# Patient Record
Sex: Female | Born: 1975 | Race: Black or African American | Hispanic: No | Marital: Single | Smoking: Never smoker
Health system: Southern US, Community
[De-identification: ages and names within clinical notes are randomized; demographics above are authoritative.]

## PROBLEM LIST (undated history)

## (undated) ENCOUNTER — Inpatient Hospital Stay (HOSPITAL_COMMUNITY): Payer: Self-pay

## (undated) DIAGNOSIS — R87610 Atypical squamous cells of undetermined significance on cytologic smear of cervix (ASC-US): Secondary | ICD-10-CM

## (undated) DIAGNOSIS — K219 Gastro-esophageal reflux disease without esophagitis: Secondary | ICD-10-CM

## (undated) DIAGNOSIS — I10 Essential (primary) hypertension: Secondary | ICD-10-CM

## (undated) DIAGNOSIS — Z8759 Personal history of other complications of pregnancy, childbirth and the puerperium: Secondary | ICD-10-CM

## (undated) HISTORY — DX: Gastro-esophageal reflux disease without esophagitis: K21.9

## (undated) HISTORY — DX: Personal history of other complications of pregnancy, childbirth and the puerperium: Z87.59

## (undated) HISTORY — DX: Atypical squamous cells of undetermined significance on cytologic smear of cervix (ASC-US): R87.610

## (undated) HISTORY — PX: DILATION AND CURETTAGE OF UTERUS: SHX78

---

## 1998-01-14 ENCOUNTER — Emergency Department (HOSPITAL_COMMUNITY): Admission: EM | Admit: 1998-01-14 | Discharge: 1998-01-14 | Payer: Self-pay | Admitting: Emergency Medicine

## 1999-01-29 ENCOUNTER — Encounter: Admission: RE | Admit: 1999-01-29 | Discharge: 1999-01-29 | Payer: Self-pay | Admitting: Family Medicine

## 1999-02-10 ENCOUNTER — Encounter: Admission: RE | Admit: 1999-02-10 | Discharge: 1999-02-10 | Payer: Self-pay | Admitting: Family Medicine

## 1999-02-26 ENCOUNTER — Encounter: Admission: RE | Admit: 1999-02-26 | Discharge: 1999-02-26 | Payer: Self-pay | Admitting: Family Medicine

## 1999-07-11 ENCOUNTER — Emergency Department (HOSPITAL_COMMUNITY): Admission: EM | Admit: 1999-07-11 | Discharge: 1999-07-11 | Payer: Self-pay | Admitting: Emergency Medicine

## 1999-07-11 ENCOUNTER — Encounter: Payer: Self-pay | Admitting: Emergency Medicine

## 1999-07-22 ENCOUNTER — Encounter: Admission: RE | Admit: 1999-07-22 | Discharge: 1999-07-22 | Payer: Self-pay | Admitting: Family Medicine

## 1999-08-03 ENCOUNTER — Encounter: Admission: RE | Admit: 1999-08-03 | Discharge: 1999-08-03 | Payer: Self-pay | Admitting: Family Medicine

## 2000-11-24 ENCOUNTER — Other Ambulatory Visit: Admission: RE | Admit: 2000-11-24 | Discharge: 2000-11-24 | Payer: Self-pay | Admitting: Obstetrics and Gynecology

## 2001-02-22 ENCOUNTER — Encounter: Admission: RE | Admit: 2001-02-22 | Discharge: 2001-02-22 | Payer: Self-pay | Admitting: Family Medicine

## 2001-03-01 ENCOUNTER — Encounter: Admission: RE | Admit: 2001-03-01 | Discharge: 2001-03-01 | Payer: Self-pay | Admitting: Family Medicine

## 2001-03-23 ENCOUNTER — Encounter: Admission: RE | Admit: 2001-03-23 | Discharge: 2001-03-23 | Payer: Self-pay | Admitting: Family Medicine

## 2001-08-28 ENCOUNTER — Encounter: Admission: RE | Admit: 2001-08-28 | Discharge: 2001-08-28 | Payer: Self-pay | Admitting: Family Medicine

## 2001-12-29 ENCOUNTER — Encounter: Admission: RE | Admit: 2001-12-29 | Discharge: 2001-12-29 | Payer: Self-pay | Admitting: Family Medicine

## 2002-01-05 ENCOUNTER — Encounter: Admission: RE | Admit: 2002-01-05 | Discharge: 2002-01-05 | Payer: Self-pay | Admitting: Family Medicine

## 2002-02-08 ENCOUNTER — Encounter: Admission: RE | Admit: 2002-02-08 | Discharge: 2002-02-08 | Payer: Self-pay | Admitting: Family Medicine

## 2002-05-09 ENCOUNTER — Other Ambulatory Visit: Admission: RE | Admit: 2002-05-09 | Discharge: 2002-05-09 | Payer: Self-pay | Admitting: *Deleted

## 2002-07-10 ENCOUNTER — Encounter: Admission: RE | Admit: 2002-07-10 | Discharge: 2002-07-10 | Payer: Self-pay | Admitting: Family Medicine

## 2002-08-29 ENCOUNTER — Encounter: Admission: RE | Admit: 2002-08-29 | Discharge: 2002-08-29 | Payer: Self-pay | Admitting: Family Medicine

## 2002-09-10 ENCOUNTER — Encounter: Admission: RE | Admit: 2002-09-10 | Discharge: 2002-09-10 | Payer: Self-pay | Admitting: Family Medicine

## 2002-11-13 ENCOUNTER — Encounter: Admission: RE | Admit: 2002-11-13 | Discharge: 2002-11-13 | Payer: Self-pay | Admitting: Family Medicine

## 2003-02-18 ENCOUNTER — Encounter: Payer: Self-pay | Admitting: Specialist

## 2003-02-18 ENCOUNTER — Encounter: Admission: RE | Admit: 2003-02-18 | Discharge: 2003-02-18 | Payer: Self-pay | Admitting: Specialist

## 2003-04-25 ENCOUNTER — Encounter: Admission: RE | Admit: 2003-04-25 | Discharge: 2003-04-25 | Payer: Self-pay | Admitting: Sports Medicine

## 2003-07-30 ENCOUNTER — Encounter: Admission: RE | Admit: 2003-07-30 | Discharge: 2003-07-30 | Payer: Self-pay | Admitting: Family Medicine

## 2003-10-08 ENCOUNTER — Encounter: Admission: RE | Admit: 2003-10-08 | Discharge: 2003-10-08 | Payer: Self-pay | Admitting: Family Medicine

## 2004-06-04 ENCOUNTER — Ambulatory Visit: Payer: Self-pay | Admitting: Family Medicine

## 2004-09-08 ENCOUNTER — Ambulatory Visit: Payer: Self-pay | Admitting: Family Medicine

## 2004-11-03 ENCOUNTER — Ambulatory Visit: Payer: Self-pay | Admitting: Family Medicine

## 2005-07-21 ENCOUNTER — Encounter (INDEPENDENT_AMBULATORY_CARE_PROVIDER_SITE_OTHER): Payer: Self-pay | Admitting: *Deleted

## 2005-07-21 ENCOUNTER — Ambulatory Visit: Payer: Self-pay | Admitting: Family Medicine

## 2005-10-06 ENCOUNTER — Emergency Department (HOSPITAL_COMMUNITY): Admission: EM | Admit: 2005-10-06 | Discharge: 2005-10-06 | Payer: Self-pay | Admitting: Family Medicine

## 2005-10-12 ENCOUNTER — Ambulatory Visit: Payer: Self-pay | Admitting: Family Medicine

## 2005-12-03 ENCOUNTER — Ambulatory Visit: Payer: Self-pay | Admitting: Family Medicine

## 2005-12-07 ENCOUNTER — Ambulatory Visit: Payer: Self-pay | Admitting: Sports Medicine

## 2005-12-09 ENCOUNTER — Ambulatory Visit: Payer: Self-pay | Admitting: Sports Medicine

## 2005-12-10 ENCOUNTER — Encounter: Admission: RE | Admit: 2005-12-10 | Discharge: 2005-12-10 | Payer: Self-pay | Admitting: Sports Medicine

## 2006-01-19 ENCOUNTER — Encounter (INDEPENDENT_AMBULATORY_CARE_PROVIDER_SITE_OTHER): Payer: Self-pay | Admitting: *Deleted

## 2006-01-19 LAB — CONVERTED CEMR LAB

## 2006-02-11 ENCOUNTER — Ambulatory Visit: Payer: Self-pay | Admitting: Family Medicine

## 2006-06-24 ENCOUNTER — Encounter (INDEPENDENT_AMBULATORY_CARE_PROVIDER_SITE_OTHER): Payer: Self-pay | Admitting: Family Medicine

## 2006-06-24 ENCOUNTER — Ambulatory Visit: Payer: Self-pay | Admitting: Family Medicine

## 2006-08-19 ENCOUNTER — Encounter (INDEPENDENT_AMBULATORY_CARE_PROVIDER_SITE_OTHER): Payer: Self-pay | Admitting: *Deleted

## 2006-11-30 ENCOUNTER — Ambulatory Visit: Payer: Self-pay | Admitting: Family Medicine

## 2006-12-26 ENCOUNTER — Telehealth: Payer: Self-pay | Admitting: *Deleted

## 2006-12-29 ENCOUNTER — Ambulatory Visit: Payer: Self-pay | Admitting: Family Medicine

## 2006-12-29 DIAGNOSIS — Z8613 Personal history of malaria: Secondary | ICD-10-CM

## 2007-07-12 ENCOUNTER — Telehealth: Payer: Self-pay | Admitting: *Deleted

## 2007-07-28 ENCOUNTER — Encounter (INDEPENDENT_AMBULATORY_CARE_PROVIDER_SITE_OTHER): Payer: Self-pay | Admitting: Family Medicine

## 2007-07-28 ENCOUNTER — Ambulatory Visit: Payer: Self-pay | Admitting: Family Medicine

## 2007-07-28 ENCOUNTER — Telehealth: Payer: Self-pay | Admitting: *Deleted

## 2007-07-28 LAB — CONVERTED CEMR LAB
Blood in Urine, dipstick: NEGATIVE
Chlamydia, DNA Probe: NEGATIVE
GC Probe Amp, Genital: NEGATIVE
Nitrite: NEGATIVE
Protein, U semiquant: NEGATIVE
Urobilinogen, UA: 0.2
WBC Urine, dipstick: NEGATIVE
Whiff Test: NEGATIVE

## 2007-08-01 ENCOUNTER — Encounter (INDEPENDENT_AMBULATORY_CARE_PROVIDER_SITE_OTHER): Payer: Self-pay | Admitting: Family Medicine

## 2007-08-09 ENCOUNTER — Telehealth (INDEPENDENT_AMBULATORY_CARE_PROVIDER_SITE_OTHER): Payer: Self-pay | Admitting: *Deleted

## 2007-08-14 ENCOUNTER — Ambulatory Visit (HOSPITAL_COMMUNITY): Admission: RE | Admit: 2007-08-14 | Discharge: 2007-08-14 | Payer: Self-pay | Admitting: Family Medicine

## 2007-08-24 ENCOUNTER — Ambulatory Visit: Payer: Self-pay | Admitting: Family Medicine

## 2008-07-06 IMAGING — US US TRANSVAGINAL NON-OB
1 series · 14 of 25 positions shown · non-contrast
Comparison: none

CLINICAL DATA: Pain.
TRANSABDOMINAL AND TRANSVAGINAL PELVIC ULTRASOUND ? 08/14/07:
TECHNIQUE: Both transabdominal and transvaginal ultrasound examinations of the pelvis were performed including evaluation of the uterus, ovaries, adnexal regions, and pelvic cul-de-sac.

[Series 1: us transvaginal non-ob · 0.24mm/px · 14 of 43 slices shown]
[im 1/43]
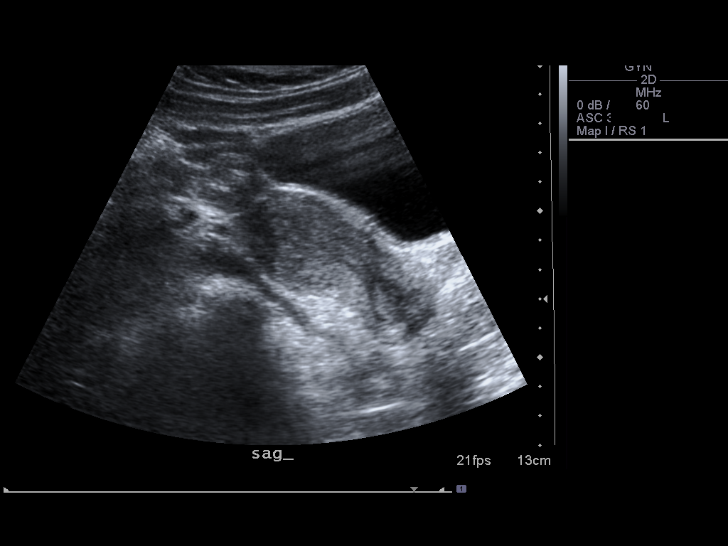
[im 4/43]
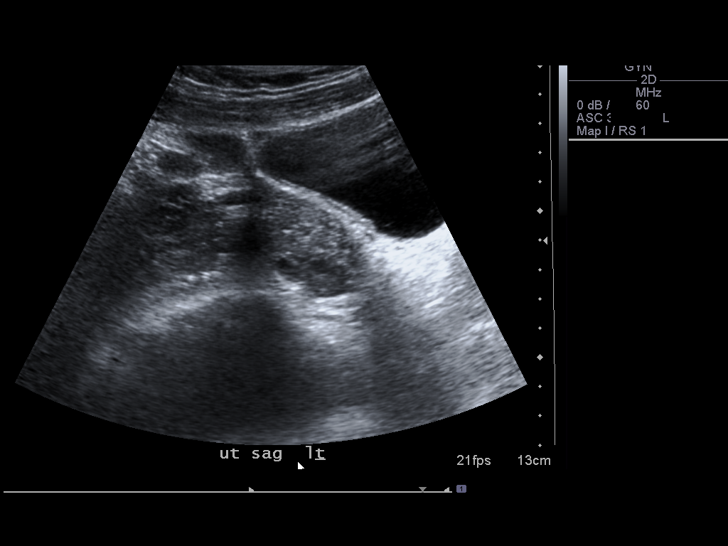
[im 8/43]
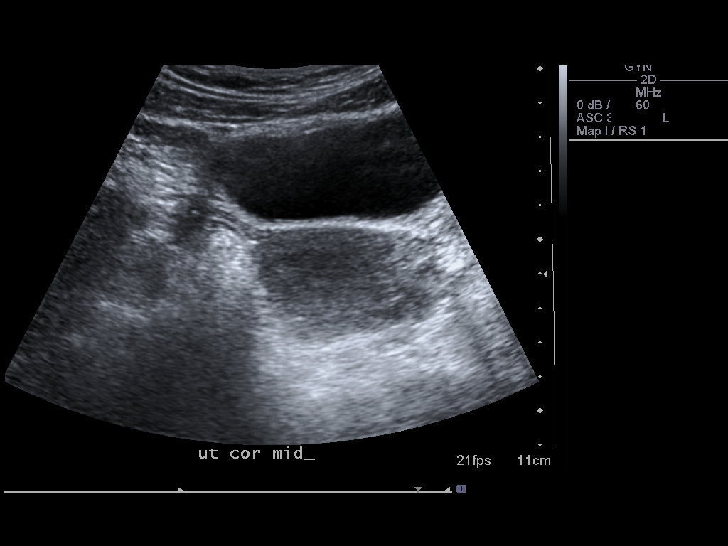
[im 11/43]
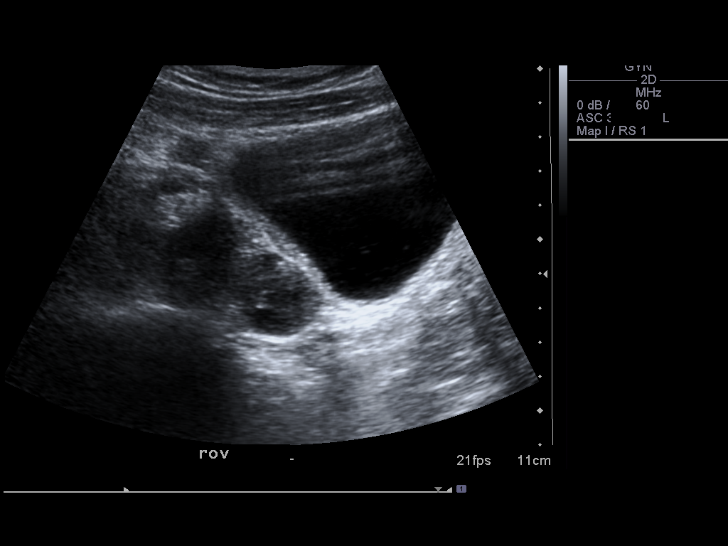
[im 15/43]
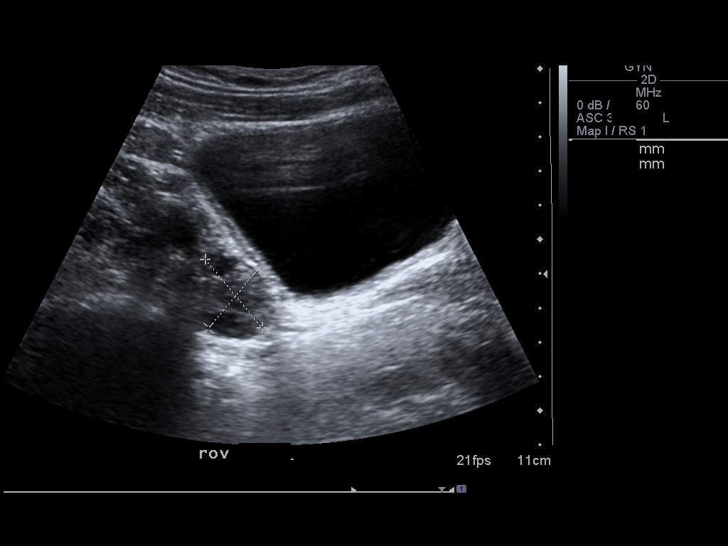
[im 16/43]
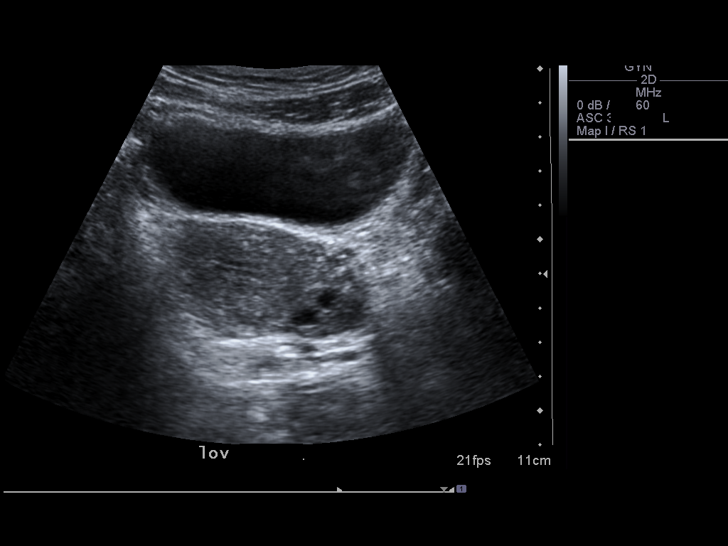
[im 20/43]
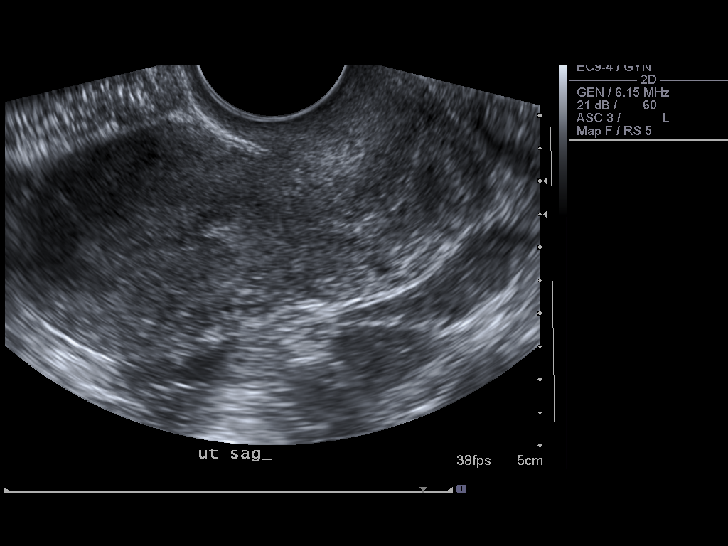
[im 23/43]
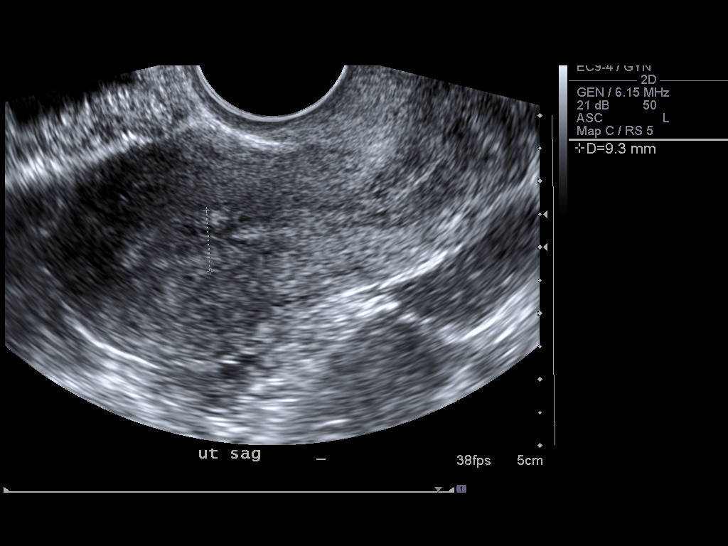
[im 27/43]
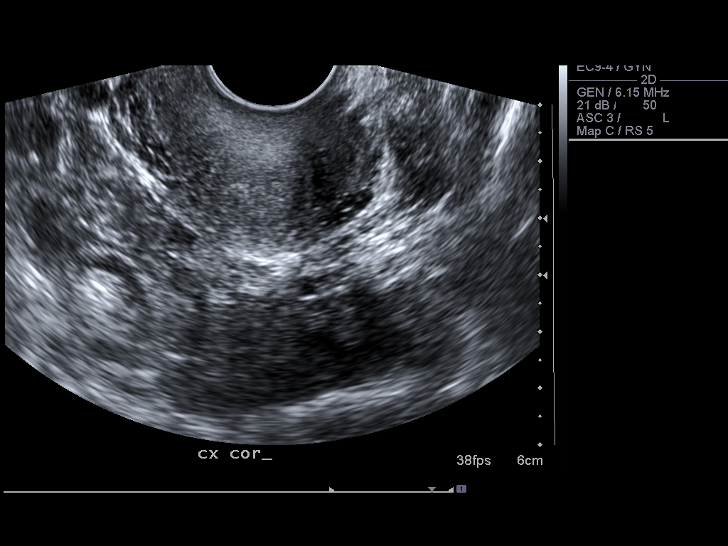
[im 29/43]
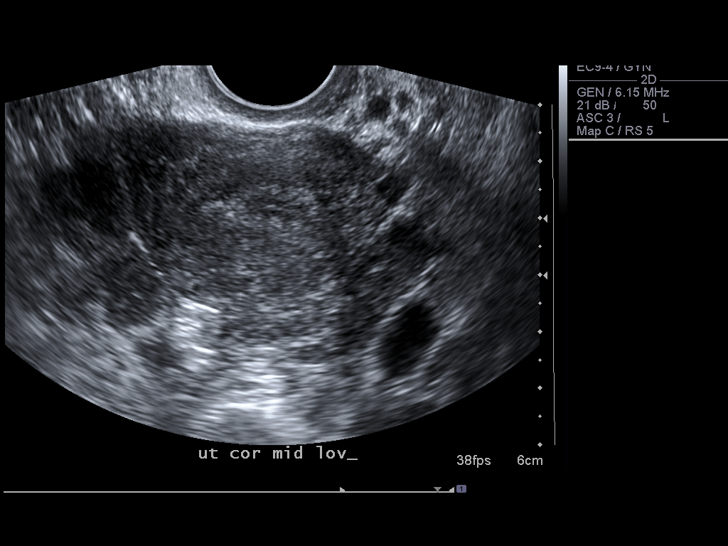
[im 32/43]
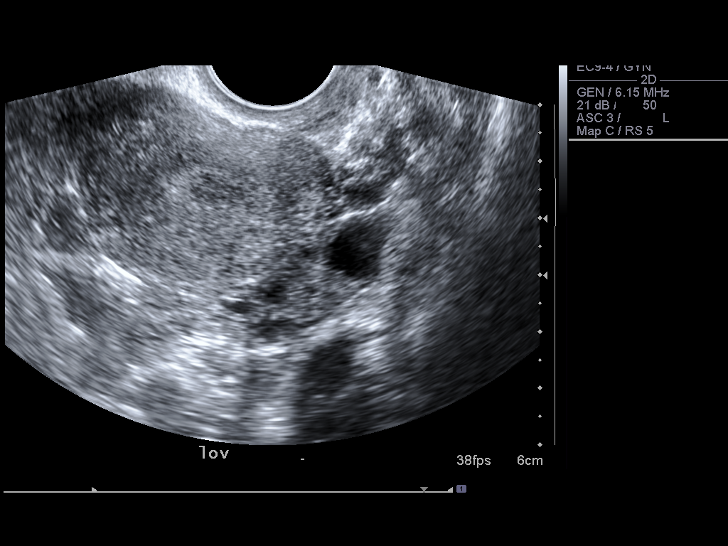
[im 36/43]
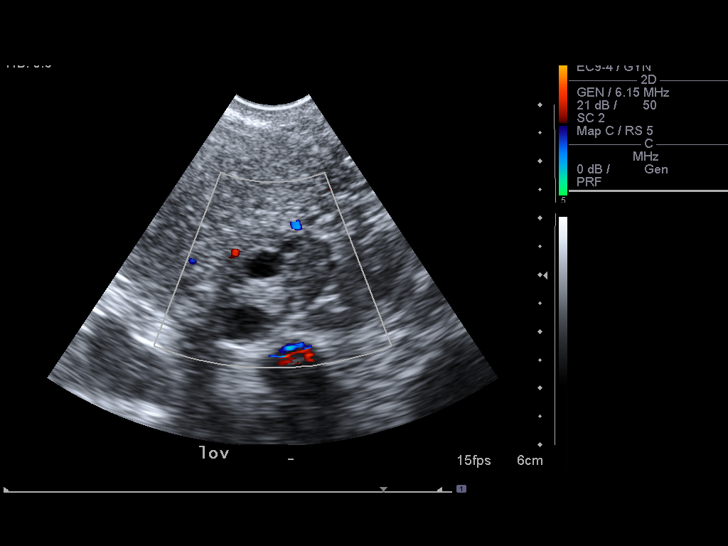
[im 39/43]
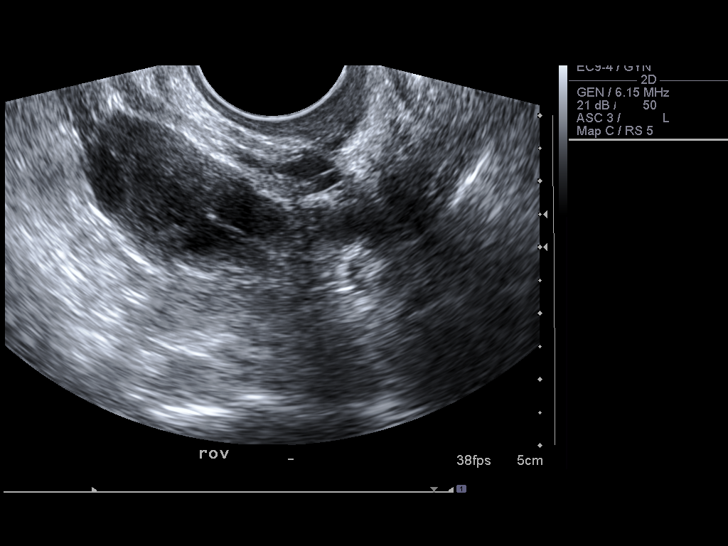
[im 43/43]
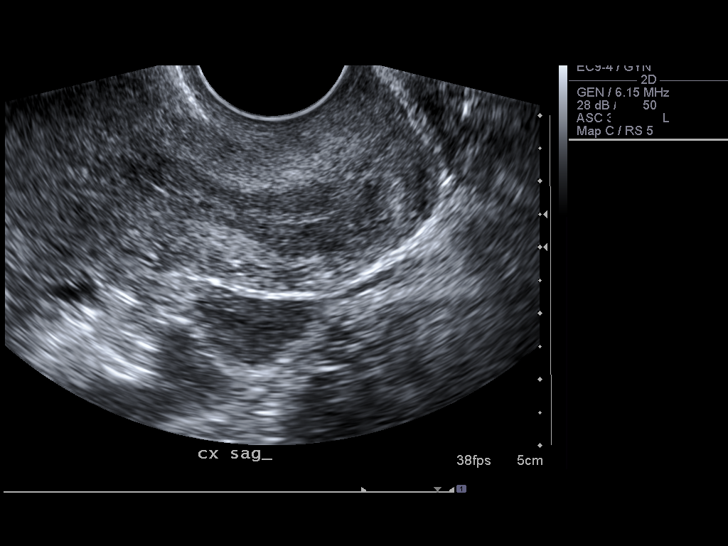

[14 of 25 positions shown; findings below may reference images not displayed]

FINDINGS: The uterus is 7.2 cm in length x 3.3 cm AP diameter x 4.5 cm in width.  The endometrium is 8 mm in thickness.  
Both ovaries are normal in size measuring 3.0 x 1.7 x 3.0 cm on the right and 3.0 x 1.3 x 1.8 cm on the left.  Both ovaries contain follicles.  No adnexal mass is identified.  There is no free fluid.
IMPRESSION: Pelvic ultrasound within normal limits.  No etiology for the patient?s pain is identified.

## 2008-08-12 ENCOUNTER — Encounter (INDEPENDENT_AMBULATORY_CARE_PROVIDER_SITE_OTHER): Payer: Self-pay | Admitting: Family Medicine

## 2008-08-12 ENCOUNTER — Ambulatory Visit: Payer: Self-pay | Admitting: Family Medicine

## 2008-08-12 LAB — CONVERTED CEMR LAB
AST: 12 units/L (ref 0–37)
BUN: 15 mg/dL (ref 6–23)
Calcium: 8.9 mg/dL (ref 8.4–10.5)
Chloride: 108 meq/L (ref 96–112)
Cholesterol: 148 mg/dL (ref 0–200)
Creatinine, Ser: 0.77 mg/dL (ref 0.40–1.20)
GC Probe Amp, Genital: NEGATIVE
HCT: 37.7 % (ref 36.0–46.0)
HDL: 63 mg/dL (ref >39)
Hemoglobin: 12.1 g/dL (ref 12.0–15.0)
MCV: 93.8 fL (ref 78.0–100.0)
Platelets: 241 10*3/uL (ref 150–400)
RDW: 12.9 % (ref 11.5–15.5)
TSH: 2.16 microintl units/mL (ref 0.350–4.50)
Total Bilirubin: 0.2 mg/dL — ABNORMAL LOW (ref 0.3–1.2)
Total CHOL/HDL Ratio: 2.3
VLDL: 9 mg/dL (ref 0–40)
Whiff Test: NEGATIVE

## 2008-08-16 ENCOUNTER — Encounter (INDEPENDENT_AMBULATORY_CARE_PROVIDER_SITE_OTHER): Payer: Self-pay | Admitting: Family Medicine

## 2009-02-18 ENCOUNTER — Telehealth: Payer: Self-pay | Admitting: Family Medicine

## 2009-02-19 ENCOUNTER — Ambulatory Visit: Payer: Self-pay | Admitting: Family Medicine

## 2009-02-19 ENCOUNTER — Encounter: Payer: Self-pay | Admitting: Family Medicine

## 2009-02-19 LAB — CONVERTED CEMR LAB
Beta hcg, urine, semiquantitative: NEGATIVE
GC Probe Amp, Genital: NEGATIVE
Whiff Test: NEGATIVE

## 2009-02-21 ENCOUNTER — Encounter (INDEPENDENT_AMBULATORY_CARE_PROVIDER_SITE_OTHER): Payer: Self-pay | Admitting: *Deleted

## 2009-09-04 ENCOUNTER — Ambulatory Visit: Payer: Self-pay | Admitting: Family Medicine

## 2009-09-04 ENCOUNTER — Encounter: Payer: Self-pay | Admitting: Family Medicine

## 2009-09-04 LAB — CONVERTED CEMR LAB
BUN: 12 mg/dL (ref 6–23)
Beta hcg, urine, semiquantitative: NEGATIVE
CO2: 22 meq/L (ref 19–32)
Calcium: 9 mg/dL (ref 8.4–10.5)
Chlamydia, DNA Probe: NEGATIVE
Chloride: 107 meq/L (ref 96–112)
Creatinine, Ser: 0.72 mg/dL (ref 0.40–1.20)
GC Probe Amp, Genital: NEGATIVE
Glucose, Bld: 121 mg/dL — ABNORMAL HIGH (ref 70–99)
HCT: 38.3 % (ref 36.0–46.0)
Hemoglobin: 12.5 g/dL (ref 12.0–15.0)
MCHC: 32.6 g/dL (ref 30.0–36.0)
MCV: 90.5 fL (ref 78.0–100.0)
Platelets: 271 10*3/uL (ref 150–400)
Potassium: 4.2 meq/L (ref 3.5–5.3)
RBC: 4.23 M/uL (ref 3.87–5.11)
RDW: 12.5 % (ref 11.5–15.5)
Sodium: 140 meq/L (ref 135–145)
TSH: 0.877 microintl units/mL (ref 0.350–4.500)
WBC: 6.2 10*3/uL (ref 4.0–10.5)
Whiff Test: NEGATIVE

## 2009-09-06 ENCOUNTER — Encounter: Payer: Self-pay | Admitting: Family Medicine

## 2009-09-15 LAB — CONVERTED CEMR LAB: Pap Smear: UNDETERMINED

## 2009-09-16 ENCOUNTER — Telehealth: Payer: Self-pay | Admitting: *Deleted

## 2009-09-17 ENCOUNTER — Telehealth: Payer: Self-pay | Admitting: *Deleted

## 2009-09-17 ENCOUNTER — Encounter: Payer: Self-pay | Admitting: *Deleted

## 2009-09-22 ENCOUNTER — Telehealth: Payer: Self-pay | Admitting: Family Medicine

## 2009-10-16 ENCOUNTER — Encounter (INDEPENDENT_AMBULATORY_CARE_PROVIDER_SITE_OTHER): Payer: Self-pay | Admitting: *Deleted

## 2009-10-16 ENCOUNTER — Ambulatory Visit: Payer: Self-pay | Admitting: Family Medicine

## 2009-10-16 ENCOUNTER — Encounter: Payer: Self-pay | Admitting: Family Medicine

## 2010-01-23 ENCOUNTER — Encounter: Payer: Self-pay | Admitting: Family Medicine

## 2010-01-23 ENCOUNTER — Ambulatory Visit: Payer: Self-pay | Admitting: Family Medicine

## 2010-01-23 ENCOUNTER — Ambulatory Visit (HOSPITAL_COMMUNITY): Admission: RE | Admit: 2010-01-23 | Discharge: 2010-01-23 | Payer: Self-pay | Admitting: Family Medicine

## 2010-01-27 ENCOUNTER — Telehealth: Payer: Self-pay | Admitting: *Deleted

## 2010-02-18 ENCOUNTER — Ambulatory Visit: Payer: Self-pay | Admitting: Family Medicine

## 2010-02-18 DIAGNOSIS — K219 Gastro-esophageal reflux disease without esophagitis: Secondary | ICD-10-CM

## 2010-05-27 ENCOUNTER — Ambulatory Visit: Payer: Self-pay | Admitting: Family Medicine

## 2010-06-01 ENCOUNTER — Telehealth: Payer: Self-pay | Admitting: Family Medicine

## 2010-07-21 NOTE — Assessment & Plan Note (Signed)
Summary: tetanus shot/bmc  Nurse Visit  needing tdap for school.  Theresia Lo RN  May 27, 2010 3:57 PM  Vital Signs:  Patient profile:   35 year old female Temp:     98.3 degrees F  Vitals Entered By: Theresia Lo RN (May 27, 2010 3:57 PM)  Allergies: No Known Drug Allergies  Immunizations Administered:  Tetanus Vaccine:    Vaccine Type: Tdap    Site: right deltoid    Mfr: GlaxoSmithKline    Dose: 0.5 ml    Route: IM    Given by: Theresia Lo RN    Exp. Date: 04/24/2011    Lot #: [id number]    VIS given: 05/08/08 version given May 27, 2010.  Orders Added: 1)  Tdap => 63yrs IM [90715] 2)  Admin 1st Vaccine [90471]     Vital Signs:  Patient profile:   35 year old female Temp:     98.3 degrees F  Vitals Entered By: Theresia Lo RN (May 27, 2010 3:57 PM)

## 2010-07-21 NOTE — Progress Notes (Signed)
Summary: phn msg  Phone Note Call from Patient Call back at [phone]   Caller: Patient Summary of Call: Pt states she is returning Dr. Philis Pique call from yesterday. Initial call taken by: Clydell Hakim,  September 16, 2009 2:20 PM  Follow-up for Phone Call        called pt and lmvm 'labs are normal, we also sent letter' Follow-up by: Arlyss Repress CMA,,  September 17, 2009 9:57 AM

## 2010-07-21 NOTE — Letter (Signed)
Summary: Results Letter  [facility]  [address]   [place], [place] [address]   Phone: [phone]  Fax: [phone]    09/06/2009  [name] [address] [place], [place]  [address]  Dear Ms. Hor,  I am happy to inform you that the results of your recent labs were all normal.  Sincerely,   Helane Rima DO  Appended Document: Generic Letter mailed.

## 2010-07-21 NOTE — Progress Notes (Signed)
Summary: meds prob  Phone Note Call from Patient Call back at Home Phone [phone]   Caller: Patient Summary of Call: states that she went to the pharmacy and only got 1 of the meds from the other day - they did not have the other - only got AMOXICILLIN 500 MG CAPS - pls call in to [facility] - [address] not the one on [address], Initial call taken by: De Nurse,  January 27, 2010 10:10 AM  Follow-up for Phone Call       Follow-up by: Golden Circle RN,  January 27, 2010 10:21 AM    Prescriptions: CLARITHROMYCIN 500 MG TABS (CLARITHROMYCIN) 1 by mouth bid  #28 x 0   Entered by:   Golden Circle RN   Authorized by:   Clementeen Graham MD   Signed by:   Golden Circle RN on 01/27/2010   Method used:   Electronically to        [facility] [phone]* (retail)       [address]       [place], [place]  [address]       Ph: [phone]       Fax: [phone]   RxID:   [id number] [facility] OMEPRAZ[facility] 20 MG TBEC (OMEPRAZOLE) 1 by mouth bid  #28 x 0   Entered by:   Golden Circle RN   Authorized by:   Clementeen Graham MD   Signed by:   Golden Circle RN on 01/27/2010   Method used:   Electronically to        [facility]. [phone]* (retail)       [address]       [place], [place]  [address]       Ph: [phone]       Fax: [phone]   RxID:   [id number]   Appended Document: meds prob Prescriptions: AMOXICILLIN 500 MG CAPS (AMOXICILLIN) 2 by mouth bid  #56 x 0   Entered by:   Theresia Lo RN   Authorized by:   Clementeen Graham MD   Signed by:   Theresia Lo RN on 01/27/2010   Method used:   Telephoned to ...       [facility] [address] [phone]* (retail)       [address]       [place], [place]  [address]       Ph: [phone]       Fax: [phone]   RxID:   [id number]  patient called from  pharmacy and they do not have the rx for amoxicillin.  rx verbally given to  pharmacist.   Appended Document: meds prob patient had stated to RN in above conversation that she only picked up omeprazole not amoxicillin  as previouisly stated. called [facility] on [address] to verify that patient did not get Rx for amoxicillin or clarithromycin and she did not.  she did pick up from [facility] on [address]. she had picked up omeprazole earlier. Marland Kitchen

## 2010-07-21 NOTE — Assessment & Plan Note (Signed)
Summary: meet new doc, check for pregnancy, pap, STD testing   Vital Signs:  Patient profile:   35 year old female Height:      64 inches Weight:      179.5 pounds BMI:     30.92 Pulse rate:   80 / minute BP sitting:   122 / 75  (right arm)  Vitals Entered By: Arlyss Repress CMA, (September 04, 2009 1:59 PM) CC: check pregnancy test. had negative preg test at home. does not use Birth control. LMP in february. pap smear   Primary Care Provider:  Helane Rima MD  CC:  check pregnancy test. had negative preg test at home. does not use Birth control. LMP in february. pap smear.  History of Present Illness: 35 year old AAF, trying to concieve, here to meet new PCP, req pregnancy test and PAP.  1. Family Planning: patient has been trying to conceive for 4 months. she recently took a pregnancy test that was negative, but would like to make sure that she is not pregnant. she endorses weight gain, breast tenderness, increased appetitie, nausea. her LMP was 08/15/09. she would also like a PAP and to be checked for STDs.   Habits & Providers  Alcohol-Tobacco-Diet     Tobacco Status: never  Current Medications (verified): 1)  Prenatal Vitamins 0.8 Mg Tabs (Prenatal Multivit-Min-Fe-Fa) .... One By Mouth Daily  Allergies (verified): No Known Drug Allergies  Past History:  Past Medical History: G0P0010 - TAB History of Malaria  Past Surgical History: None  Family History: Father: DM Mother: healthy Brothers: HLD  Social History: Sexually active, denies etoh or tob, illicits. Exercises by lifting weights and walking on treadmill up to 5 days per week; 4 female partners in past; lives in [place]; graduated [employer]; pt is from [place].  Review of Systems General:  Denies chills and fever. GI:  Denies abdominal pain, change in bowel habits, indigestion, and vomiting. GU:  Denies abnormal vaginal bleeding, discharge, dysuria, genital sores, and urinary frequency. Psych:   Denies anxiety and depression.  Physical Exam  General:  Well-developed, well-nourished, in no acute distress; alert, appropriate and cooperative throughout examination. Vitals reviewed.  Abdomen:  Bowel sounds positive, abdomen soft and non-tender without masses, organomegaly or hernias noted. Genitalia:  Pelvic Exam:        External: normal female genitalia without lesions or masses        Vagina: normal without lesions or masses        Cervix: normal without lesions or masses        Adnexa: normal bimanual exam without masses or fullness        Uterus: normal by palpation        Pap smear: performed Psych:  Oriented X3, memory intact for recent and remote, normally interactive, good eye contact, and not anxious appearing.     Impression & Recommendations:  Problem # 1:  ROUTINE GYNECOLOGICAL EXAMINATION (ICD-V72.31) Assessment Unchanged  Orders: [facility]- Est  Level 4 ([address])  Problem # 2:  SCREENING FOR MALIGNANT NEOPLASM OF THE CERVIX (ICD-V76.2) Assessment: Unchanged  Orders: Pap Smear-[facility] ([id number]) [facility]- Est  Level 4 ([address])  Problem # 3:  CONTACT OR EXPOSURE TO OTHER VIRAL DISEASES (ICD-V01.79) Assessment: Unchanged  Orders: GC/Chlamydia-[facility] (87591/87491) HIV-[facility] (96295-28413) RPR-[facility] (24401-02725) Wet Prep- [facility] (36[address]) [facility]- Est  Level 4 (03474)  Problem # 4:  AMENORRHEA (ICD-626.0) Assessment: Unchanged Negative. Adivised patient to recheck in 2 weeks if she is still suspicious of pregnancy. Start  prenatal vitamins. Will check routine labs. Orders: U Preg-[facility] (81025) [facility]- Est  Level 4 ([address])  Problem # 5:  FAMILY PLANNING (ICD-V25.09) Assessment: New  Preconception counseling given.  Orders: [facility]- Est  Level 4 (09811)  Complete Medication List: 1)  Prenatal Vitamins 0.8 Mg Tabs (Prenatal multivit-min-fe-fa) .... One by mouth daily  Other Orders: Basic Met-[facility] [phone]) CBC-[facility] [phone]) TSH-[facility] [phone]) HPV Typing-[facility]  (28413-24401)  Patient Instructions: 1)  It was nice to meet you today! 2)  I'm sorry to say that you are not pregnant today.  3)  I suggest having sex every other day to increase your chances of getting pregnant.  Prevention & Chronic Care Immunizations   Influenza vaccine: Not documented    Tetanus booster: 02/19/1997: Done.   Tetanus booster due: 02/20/2007    Pneumococcal vaccine: Not documented  Other Screening   Pap smear: NEGATIVE FOR INTRAEPITHELIAL LESIONS OR MALIGNANCY.  (08/12/2008)   Pap smear due: 01/20/2007   Smoking status: never  (09/04/2009)   Laboratory Results   Urine Tests  Date/Time Received: September 04, 2009 2:11 PM  Date/Time Reported: September 04, 2009 2:30 PM     Urine HCG: negative Comments: ...........test performed by...........Marland KitchenTerese Door, CMA  Date/Time Received: September 04, 2009 2:40 PM  Date/Time Reported: September 04, 2009 2:46 PM   [employer] Source: vaginal WBC/hpf: 10-20 Bacteria/hpf: 3+  Rods Clue cells/hpf: none  Negative whiff Yeast/hpf: none Trichomonas/hpf: none Comments: ...........test performed by...........Marland KitchenTerese Door, CMA     Appended Document: Orders Update    Clinical Lists Changes  Orders: Added new Test order of [facility] - Est  18-39 yrs [phone]) - Signed

## 2010-07-21 NOTE — Letter (Signed)
Summary: Generic Letter  [facility]  [address]   [place], [place] [address]   Phone: [phone]  Fax: [phone]    09/17/2009  Crystal Wilcox [address] [place], [place]  [address]  Dear Ms. Fester,  We have received your pap smear results, which showed some abnormal cells.  Please call our office and schedule appointment for Colposcopy.  (see insert with information on Colposcopy)         Sincerely,   Arlyss Repress CMA,

## 2010-07-21 NOTE — Assessment & Plan Note (Signed)
Summary: f/u eo   Vital Signs:  Patient profile:   35 year old female Height:      64 inches Weight:      190 pounds BMI:     32.73 Temp:     98.4 degrees F oral Pulse rate:   84 / minute BP sitting:   119 / 76  (left arm)  Vitals Entered By: Tessie Fass CMA (February 18, 2010 4:03 PM) CC: F/U Is Patient Diabetic? No   Primary Care Provider:  Helane Rima MD  CC:  F/U.  History of Present Illness: 35 yo F:  1. Dyspepsia: presented last week with epigastric tenderness, sour taste in mouth, and a feeling of something stuck in her throat. she was Dx with + H. pylori gastritis and Tx with triple therapy. today, she presents with the same sxs. she finished the Abx and is still taking the omperazole 20 mg by mouth once daily.  Habits & Providers  Alcohol-Tobacco-Diet     Tobacco Status: never  Current Medications (verified): 1)  Prenatal Vitamins 0.8 Mg Tabs (Prenatal Multivit-Min-Fe-Fa) .... One By Mouth Daily 2)  [facility] Omeprazole 20 Mg Tbec (Omeprazole) .Marland Kitchen.. 1 By Mouth Bid  Allergies (verified): No Known Drug Allergies PMH-FH-SH reviewed for relevance  Review of Systems General:  Denies chills and fever. CV:  Denies chest pain or discomfort, shortness of breath with exertion, and swelling of feet. Resp:  Denies cough. GI:  Complains of indigestion; denies abdominal pain, bloody stools, constipation, diarrhea, nausea, vomiting, and vomiting blood.  Physical Exam  General:  VS noted. Well appearing woman in NAD. Lungs:  Normal respiratory effort, chest expands symmetrically. Lungs are clear to auscultation, no crackles or wheezes. Heart:  Normal rate and regular rhythm. S1 and S2 normal without gallop, murmur, click, rub or other extra sounds. Abdomen:  Mild epigastric ttp. Psych:  Oriented X3, memory intact for recent and remote, normally interactive, good eye contact, not anxious appearing, and not depressed appearing.   Additional Exam:  Pain resolved after GI  cocktail administration.   Impression & Recommendations:  Problem # 1:  HELICOBACTER PYLORI GASTRITIS (ICD-041.86) Assessment Unchanged  Patient finished Abx. Explained Dx and behavioral modifications needed for treatment. Provided handout. Patient to increase Omeprazole to two times a day. Follow up in 3-4 weeks if not improving or sooner if worsening. Red Flags given for promptly seeking medical care.  Orders: [facility]- Est Level  3 ([address])  Complete Medication List: 1)  Prenatal Vitamins 0.8 Mg Tabs (Prenatal multivit-min-fe-fa) .... One by mouth daily 2)  [facility] Omeprazole 20 Mg Tbec (Omeprazole) .Marland Kitchen.. 1 by mouth bid  Other Orders: EMR miscellaneous medications [facility])  Patient Instructions: 1)  See Handout. Prescriptions: [facility] OMEPRAZOLE 20 MG TBEC (OMEPRAZOLE) 1 by mouth bid  #60 x 0   Entered and Authorized by:   Helane Rima DO   Signed by:   Helane Rima DO on 02/18/2010   Method used:   Electronically to        [facility]. [phone]* (retail)       [address]       [place], [place]  [address]       Ph: [phone]       Fax: [phone]   RxID:   [id number]    Medication Administration  Medication # 1:    Medication: EMR miscellaneous medications    Diagnosis: GERD (ICD-530.81)    Dose: 30ml  Route: po    Exp Date: 05/28/2010    Lot #: [id number]    Mfr: [facility]    Comments: 30ml given PO    Patient tolerated medication without complications    Given by: Tessie Fass CMA (February 18, 2010 4:20 PM)  Orders Added: 1)  EMR miscellaneous medications [EMRORAL] 2)  [facility]- Est Level  3 [99213]

## 2010-07-21 NOTE — Assessment & Plan Note (Signed)
Summary: stomach problem,tcb   Vital Signs:  Patient profile:   35 year old female Height:      64 inches Weight:      187 pounds BMI:     32.21 O2 Sat:      99 % on Room air Temp:     98.3 degrees F oral Pulse rate:   84 / minute BP sitting:   126 / 79  (left arm) Cuff size:   regular  Vitals Entered By: Tessie Fass CMA (January 23, 2010 3:00 PM)  O2 Flow:  Room air CC: nausea, SOB Is Patient Diabetic? No Pain Assessment Patient in pain? no        Primary Care Provider:  Helane Rima MD  CC:  nausea and SOB.  History of Present Illness: ABDOMINAL PAIN Location: Mid epigastric Onset: 1-2 months ago worsening this week Description: Crampy non radiating associated with nausea.  Modifying factors: Not really associated with food or exertion.    Symptoms Nausea/Vomiting: Nausea only Diarrhea: No Constipation: No Melena/BRBPR: No Hematemesis: No Anorexia: No Fever/Chills: No Jaundice: No Dysuria: No Back pain: No Rash: No Weight loss: No Vaginal bleeding: No STD exposure: No LMP: 2 days ago irreg Alcohol use: No NSAID use: No  PMH Past Surgeries: NA  CHEST PAIN Location: Substernal non radiating Description: "pinching" Onset: 1-2 months worse this week  Symptoms Trauma: No Nausea/vomiting: nausea only as with abdominal pain Diaphoresis: No Shortness of breath: No Cough: No Edema: NO Orthopnea: no Syncope: no Indigestion: yes  Red Flags Worse with exertion: no Recent Immobility: no Cancer history: no Tearing/radiation to back: no    Habits & Providers  Alcohol-Tobacco-Diet     Tobacco Status: never  Current Problems (verified): 1)  Helicobacter Pylori Gastritis  (ICD-041.86) 2)  Chest Pain, Acute  (ICD-786.50) 3)  Colposcopy With Biopsy, Hx of  (ICD-V15.89) 4)  Family Planning  (ICD-V25.09) 5)  Ascus Pap  (ICD-795.01) 6)  Fatigue  (ICD-780.79) 7)  Weight Gain  (ICD-783.1) 8)  Screening For Malignant Neoplasm of The Cervix   (ICD-V76.2) 9)  Routine Gynecological Examination  (ICD-V72.31) 10)  Irregular Menses  (ICD-626.4) 11)  Contact or Exposure To Other Viral Diseases  (ICD-V01.79) 12)  Screening For Malignant Neoplasm of The Cervix  (ICD-V76.2) 13)  Malaria, Hx of  (ICD-V12.03)  Current Medications (verified): 1)  Prenatal Vitamins 0.8 Mg Tabs (Prenatal Multivit-Min-Fe-Fa) .... One By Mouth Daily 2)  [facility] Omeprazole 20 Mg Tbec (Omeprazole) .Marland Kitchen.. 1 By Mouth Bid 3)  Amoxicillin 500 Mg Caps (Amoxicillin) .... 2 By Mouth Bid 4)  Clarithromycin 500 Mg Tabs (Clarithromycin) .Marland Kitchen.. 1 By Mouth Bid  Allergies (verified): No Known Drug Allergies  Past History:  Past Medical History: Last updated: 09/04/2009 G0P0010 - TAB History of Malaria  Past Surgical History: Last updated: 09/04/2009 None  Family History: Last updated: 09/04/2009 Father: DM Mother: healthy Brothers: HLD  Social History: Last updated: 09/04/2009 Sexually active, denies etoh or tob, illicits. Exercises by lifting weights and walking on treadmill up to 5 days per week; 4 female partners in past; lives in [place]; graduated [employer]; pt is from [place].  Risk Factors: Smoking Status: never (01/23/2010)  Review of Systems       The patient complains of chest pain and abdominal pain.  The patient denies anorexia, fever, weight loss, syncope, dyspnea on exertion, peripheral edema, prolonged cough, headaches, hemoptysis, melena, hematochezia, severe indigestion/heartburn, hematuria, incontinence, genital sores, suspicious skin lesions, difficulty walking, unusual weight change,  abnormal bleeding, and breast masses.    Physical Exam  General:  VS noted. Well appearing woman in NAD Head:  Normocephalic and atraumatic without obvious abnormalities. Mouth:  Oral mucosa and oropharynx without lesions or exudates.  Teeth in good repair. Lungs:  Normal respiratory effort, chest expands symmetrically. Lungs are clear to auscultation,  no crackles or wheezes. Heart:  Normal rate and regular rhythm. S1 and S2 normal without gallop, murmur, click, rub or other extra sounds. Abdomen:  Bowel sounds positive,abdomen soft and non-tender without masses, organomegaly or hernias noted. Extremities:  Non edemetus BL LE Cervical Nodes:  No lymphadenopathy noted Axillary Nodes:  No palpable lymphadenopathy Psych:  Cognition and judgment appear intact. Alert and cooperative with normal attention span and concentration. No apparent delusions, illusions, hallucinations   Impression & Recommendations:  Problem # 1:  HELICOBACTER PYLORI GASTRITIS (ICD-041.86) Assessment New  Symptoms of ongoing non-specific abdominal pain with some nausea suspicious for H.Pylori. Antibody was positive.  Will complete a 2 week trial of tripple therpay and follow up with Dr. Earlene Plater in 2 weeks. Red flags negative and reviewed with patient who agreed.   Orders: [facility]- Est Level  3 ([address])  Problem # 2:  CHEST PAIN, ACUTE (ICD-786.50) Assessment: New Atypical chest pain. Likley associated with above issue. 35 yo woman with no cardiac risk factors means CAD very unlikley. ECG normal.  No concerning associated symptoms or exam findings. Plan to follow up with Dr. Earlene Plater in 2 weeks. Red flags reviewed.  May benifit from lipids assesment for risk factor stratification however pt activly trying to become preganant thus statins would be contraindicated.   Orders: 12 Lead EKG (12 Lead EKG) [facility]- Est Level  3 ([address])  Complete Medication List: 1)  Prenatal Vitamins 0.8 Mg Tabs (Prenatal multivit-min-fe-fa) .... One by mouth daily 2)  [facility] Omeprazole 20 Mg Tbec (Omeprazole) .Marland Kitchen.. 1 by mouth bid 3)  Amoxicillin 500 Mg Caps (Amoxicillin) .... 2 by mouth bid 4)  Clarithromycin 500 Mg Tabs (Clarithromycin) .Marland Kitchen.. 1 by mouth bid  Other Orders: U Preg-[facility] (81025) H pylori-[facility] (09811)  Patient Instructions: 1)  Thank you for seeing me today. 2)  You have H. Pylori a  common stomach bacteria. 3)  It is treated with 3 medications that you take twice a day for 2 weeks.  4)  Please schedule a follow-up appointment in 2 weeks with Dr. Earlene Plater.  5)  If you have chest pain, difficulty breathing, fevers over 102 that does not get better with tylenol please call us or see a doctor.  Prescriptions: CLARITHROMYCIN 500 MG TABS (CLARITHROMYCIN) 1 by mouth bid  #28 x 0   Entered and Authorized by:   Clementeen Graham MD   Signed by:   Clementeen Graham MD on 01/23/2010   Method used:   Electronically to        [facility] [address] [phone]* (retail)       [address] [place], [place]  [address]       Ph: [phone]       Fax: [phone]   RxID:   [id number] AMOXICILLIN 500 MG CAPS (AMOXICILLIN) 2 by mouth bid  #56 x 0   Entered and Authorized by:   Clementeen Graham MD   Signed by:   Clementeen Graham MD on 01/23/2010   Method used:   Electronically to        [facility]. [phone]* (retail)  [address] [place], [place]  [address]       Ph: [phone]       Fax: [phone]   RxID:   [id number] [facility] OMEPRAZOLE 20 MG TBEC (OMEPRAZOLE) 1 by mouth bid  #28 x 0   Entered and Authorized by:   Clementeen Graham MD   Signed by:   Clementeen Graham MD on 01/23/2010   Method used:   Electronically to        [facility] [address] [phone]* (retail)       [address] [place], [place]  [address]       Ph: [phone]       Fax: [phone]   RxID:   [id number] [facility] OMEPRAZOLE 20 MG TBEC (OMEPRAZOLE) 1 by mouth daily  #30 x 3   Entered and Authorized by:   Clementeen Graham MD   Signed by:   Clementeen Graham MD on 01/23/2010   Method used:   Electronically to        [facility] [address] [phone]* (retail)       [address] [place], [place]  [address]       Ph: [phone]       Fax: [phone]   RxID:   [id number]   Laboratory Results   Urine Tests  Date/Time Received: January 23, 2010 3:57 PM  Date/Time Reported:  January 23, 2010 4:05 PM     Urine HCG: negative Comments: ...........test performed by...........Marland KitchenTerese Door, CMA   Blood Tests   Date/Time Received: January 23, 2010 3:57 PM  Date/Time Reported: January 23, 2010 4:08 PM    H. pylori: positive Comments: ...............test performed by......Marland KitchenBonnie A. [place], MLS (ASCP)cm

## 2010-07-21 NOTE — Progress Notes (Signed)
Summary: Rx Prob  Phone Note Call from Patient Call back at [phone]   Caller: Patient Summary of Call: Pt states she was to get a rx sent into her pharmacy for vaginal discharge.  The pharmacy was [facility] on [address]. Initial call taken by: Clydell Hakim,  September 22, 2009 4:21 PM  Follow-up for Phone Call        will forward to MD. do not see that an rx was sent for med for vaginal discharge at her last visit. will send message to MD to please advise. Follow-up by: Theresia Lo RN,  September 22, 2009 4:38 PM  Additional Follow-up for Phone Call Additional follow up Details #1::        I did not prescribe any Rx for vaginal discharge. Additional Follow-up by: Helane Rima DO,  September 22, 2009 7:30 PM    Additional Follow-up for Phone Call Additional follow up Details #2::    patient notified. she states she is not really having a problem. she states when MD did her pap she stated ' You have a lot of discharge " and she thought she was going to prescribe something. she has an appointment on 10/09/2009 and will discuss at that time. Follow-up by: Theresia Lo RN,  September 23, 2009 10:26 AM

## 2010-07-21 NOTE — Progress Notes (Signed)
Summary: triage  Phone Note Call from Patient Call back at Home Phone [phone]   Caller: Patient Summary of Call: pt having stomach pain for about a week now can she be seen today or tomorrow? Initial call taken by: Clydell Hakim,  February 18, 2009 10:12 AM  Follow-up for Phone Call        LMP was one day only on 01/23/09. now has lower abd pain x 1 week. denies constipation. appt made for tomorrow at 8:30am to continue taking ibu with food. states this does help Follow-up by: Golden Circle RN,  February 18, 2009 10:29 AM

## 2010-07-21 NOTE — Letter (Signed)
Summary: Out of Work  [facility]  [address]   [place], [place] [address]   Phone: [phone]  Fax: [phone]    October 16, 2009   Employee:  Marya Amsler [employer]    To Whom It May Concern:   For Medical reasons, please excuse the above named employee from work for the following dates:  Start:   October 16, 2009  End:  October 16, 2009  If you need additional information, please feel free to contact our office.         Sincerely,    Gladstone Pih

## 2010-07-21 NOTE — Assessment & Plan Note (Signed)
Summary: abnormal pap/bmc   Vital Signs:  Patient profile:   35 year old female Height:      64 inches Weight:      180.0 pounds BMI:     31.01 Temp:     98.7 degrees F oral Pulse rate:   85 / minute BP sitting:   113 / 75  (left arm) Cuff size:   regular  Vitals Entered By: Gladstone Pih (October 16, 2009 9:48 AM) CC: colpo Is Patient Diabetic? No Pain Assessment Patient in pain? no        Primary Care Provider:  Helane Rima MD  CC:  colpo.  History of Present Illness: ASCUS with + hi risk HPV asymptomatic no prior abnl  Habits & Providers  Alcohol-Tobacco-Diet     Tobacco Status: never  Allergies: No Known Drug Allergies  Physical Exam  Genitalia:  normal introitus, no external lesions, no vaginal discharge, and mucosa pink and moist.   Additional Exam:  Patient given informed consent, signed copy in the chart. Placed in lithotomy position. Cervix viewed with speculum and colposcope. Was the entire squamocolumnar junction seen?yes easily, small amt ectroion on anterior lip Any acetowhite lesions noted?no Any abnormalities seen with green filter?no Any abnormalities seen with application of Lugol's solution?no Was the endocervical canal sampled?no Were any cervical biopsies taken?no Were there any complications?no COMMENTS: Patient was given post procedure instructions.     Impression & Recommendations:  Problem # 1:  ASCUS PAP (ICD-795.01)  Orders: Colposcopy w/out biopsy [facility] ([id number]) clinically normal colposcopy repeat pap 1 year  Complete Medication List: 1)  Prenatal Vitamins 0.8 Mg Tabs (Prenatal multivit-min-fe-fa) .... One by mouth daily  Other Orders: [facility] [id number])   Laboratory Results   Urine Tests  Date/Time Received: October 16, 2009 9:55 AM  Date/Time Reported: October 16, 2009 9:59 AM     Urine HCG: negative Comments: ...........test performed by...........Marland KitchenTerese Door, CMA

## 2010-07-21 NOTE — Miscellaneous (Signed)
Summary: Procedures consent  Procedures consent   Imported By: De Nurse 11/07/2009 16:46:04  _____________________________________________________________________  External Attachment:    Type:   Image     Comment:   External Document

## 2010-07-21 NOTE — Progress Notes (Signed)
Summary: PT NEEDS APPT IN COLPO/TS  Phone Note Call from Patient Call back at [phone]   Caller: Patient Summary of Call: Pt calling back concerning her pap smear. Initial call taken by: Clydell Hakim,  September 17, 2009 11:44 AM  Follow-up for Phone Call        called pt and lmvm to call us back and sched. appt in colpo clinic. SEE LETTER. waiting for pt to call back. Follow-up by: Arlyss Repress CMA,,  September 18, 2009 8:48 AM  Additional Follow-up for Phone Call Additional follow up Details #1::        pt scheduled for 10/09/09 in [facility].  If earlier appt needed please check schedule and see if she can be put in and contact her.  Pt also would like a phone call regarding the results of the lab test.  Was only informed by nurse that is was abnormal.  You can reach her at [phone] Additional Follow-up by: Abundio Miu    Additional Follow-up for Phone Call Additional follow up Details #2::    CALLED pt and explained lab tests all are WNL. only pap smear had abnormal cells. Letter about procedure in mail. pt sched. appt for colpo. Follow-up by: Arlyss Repress CMA,,  September 18, 2009 2:25 PM

## 2010-07-23 NOTE — Progress Notes (Signed)
Summary: UPDATE PROBLEM LIST   

## 2010-07-25 ENCOUNTER — Encounter: Payer: Self-pay | Admitting: *Deleted

## 2010-09-03 ENCOUNTER — Other Ambulatory Visit: Payer: Self-pay | Admitting: Family Medicine

## 2010-09-03 ENCOUNTER — Encounter: Payer: Self-pay | Admitting: Family Medicine

## 2010-09-03 ENCOUNTER — Other Ambulatory Visit (HOSPITAL_COMMUNITY)
Admission: RE | Admit: 2010-09-03 | Discharge: 2010-09-03 | Disposition: A | Discharge status: Home / Self Care | Payer: BC Managed Care – PPO | Source: Ambulatory Visit | Attending: Family Medicine | Admitting: Family Medicine

## 2010-09-03 ENCOUNTER — Ambulatory Visit (INDEPENDENT_AMBULATORY_CARE_PROVIDER_SITE_OTHER): Payer: BC Managed Care – PPO | Admitting: Family Medicine

## 2010-09-03 VITALS — BP 104/70 | HR 79 | Temp 99.4°F | Ht 64.0 in | Wt 180.0 lb

## 2010-09-03 DIAGNOSIS — Z20828 Contact with and (suspected) exposure to other viral communicable diseases: Secondary | ICD-10-CM

## 2010-09-03 DIAGNOSIS — R1013 Epigastric pain: Secondary | ICD-10-CM

## 2010-09-03 DIAGNOSIS — Z833 Family history of diabetes mellitus: Secondary | ICD-10-CM

## 2010-09-03 DIAGNOSIS — Z01419 Encounter for gynecological examination (general) (routine) without abnormal findings: Secondary | ICD-10-CM | POA: Insufficient documentation

## 2010-09-03 DIAGNOSIS — R87610 Atypical squamous cells of undetermined significance on cytologic smear of cervix (ASC-US): Secondary | ICD-10-CM

## 2010-09-03 DIAGNOSIS — Z0000 Encounter for general adult medical examination without abnormal findings: Secondary | ICD-10-CM

## 2010-09-03 DIAGNOSIS — K3189 Other diseases of stomach and duodenum: Secondary | ICD-10-CM

## 2010-09-03 DIAGNOSIS — E663 Overweight: Secondary | ICD-10-CM | POA: Insufficient documentation

## 2010-09-03 LAB — COMPREHENSIVE METABOLIC PANEL
AST: 16 U/L (ref 0–37)
Albumin: 4 g/dL (ref 3.5–5.2)
Alkaline Phosphatase: 42 U/L (ref 39–117)
BUN: 10 mg/dL (ref 6–23)
Potassium: 3.8 mEq/L (ref 3.5–5.3)

## 2010-09-03 LAB — CONVERTED CEMR LAB
AST: 16 units/L (ref 0–37)
Alkaline Phosphatase: 42 units/L (ref 39–117)
BUN: 10 mg/dL (ref 6–23)
Creatinine, Ser: 0.79 mg/dL (ref 0.40–1.20)
HCT: 39.4 % (ref 36.0–46.0)
HDL: 45 mg/dL (ref >39)
Hemoglobin: 12.6 g/dL (ref 12.0–15.0)
LDL Cholesterol: 124 mg/dL — ABNORMAL HIGH (ref 0–99)
MCHC: 32 g/dL (ref 30.0–36.0)
MCV: 91.2 fL (ref 78.0–100.0)
RDW: 12.7 % (ref 11.5–15.5)
TSH: 1.136 microintl units/mL (ref 0.350–4.500)
Total Bilirubin: 0.4 mg/dL (ref 0.3–1.2)
Total CHOL/HDL Ratio: 4.1

## 2010-09-03 LAB — POCT WET PREP (WET MOUNT)
Clue Cells Wet Prep HPF POC: NEGATIVE
Yeast Wet Prep HPF POC: NEGATIVE

## 2010-09-03 LAB — LIPID PANEL
HDL: 45 mg/dL (ref >39)
LDL Cholesterol: 124 mg/dL — ABNORMAL HIGH (ref 0–99)
Total CHOL/HDL Ratio: 4.1 Ratio
Triglycerides: 86 mg/dL (ref <150)
VLDL: 17 mg/dL (ref 0–40)

## 2010-09-03 NOTE — Progress Notes (Signed)
  Subjective:    Patient ID: Crystal Wilcox, female    DOB: February 04, 1976, 35 y.o.   MRN: [id number]  HPI    Review of Systems     Objective:   Physical Exam        Assessment & Plan:   Subjective:     Crystal Wilcox is a 35 y.o. female here for a routine exam.  Current complaints: Dyspepsia, and wants to be checked for STDs since she has a new partner.  Personal health questionnaire reviewed: not asked.   Gynecologic History Patient's last menstrual period was 08/31/2010. Contraception: condoms Last Pap: 09/04/09. Results were: abnormal Last mammogram: None.   Obstetric History OB History    Grav Para Term Preterm Abortions TAB SAB Ect Mult Living                   The following portions of the patient's history were reviewed and updated as appropriate: allergies, current medications, past family history, past medical history, past social history, past surgical history and problem list.  Review of Systems A comprehensive review of systems was negative except for: Gastrointestinal: positive for dyspepsia    Objective:    BP 104/70  Pulse 79  Temp(Src) 99.4 F (37.4 C) (Oral)  Ht 5\' 4"  (1.626 m)  Wt 180 lb (81.647 kg)  BMI 30.90 kg/m2  LMP 08/31/2010  General Appearance:    Alert, cooperative, no distress, appears stated age  Head:    Normocephalic, without obvious abnormality, atraumatic  Eyes:    PERRL, conjunctiva/corneas clear, EOM's intact, fundi    benign, both eyes  Ears:    Normal TM's and external ear canals, both ears  Nose:   Nares normal, septum midline, mucosa normal, no drainage    or sinus tenderness  Throat:   Lips, mucosa, and tongue normal; teeth and gums normal  Neck:   Supple, symmetrical, trachea midline, no adenopathy;    thyroid:  no enlargement/tenderness/nodules; no carotid   bruit or JVD     Lungs:     Clear to auscultation bilaterally, respirations unlabored  Chest Wall:    No tenderness or deformity   Heart:    Regular  rate and rhythm, S1 and S2 normal, no murmur, rub   or gallop     Abdomen:     Soft, non-tender, bowel sounds active all four quadrants,    no masses, no organomegaly  Genitalia:    Normal female without lesion, discharge or tenderness.     Extremities:   Extremities normal, atraumatic, no cyanosis or edema  Pulses:   2+ and symmetric all extremities  Skin:   Skin color, texture, turgor normal, no rashes or lesions  Lymph nodes:   Cervical, supraclavicular, and axillary nodes normal  Neurologic:   CNII-XII intact, normal strength, sensation and reflexes    throughout      Assessment:    Healthy female exam.    Plan:    Follow up in: 1 year.

## 2010-09-03 NOTE — Assessment & Plan Note (Signed)
Reviewed behavioral modifications. Restart Omeprazole.

## 2010-09-03 NOTE — Patient Instructions (Signed)
It was nice to see you today.  We are checking labs today. 

## 2010-09-03 NOTE — Assessment & Plan Note (Signed)
Check yearly labs. Patient fasting.

## 2010-09-04 ENCOUNTER — Encounter: Payer: Self-pay | Admitting: Family Medicine

## 2010-09-04 LAB — CBC
HCT: 39.4 % (ref 36.0–46.0)
MCHC: 32 g/dL (ref 30.0–36.0)
RDW: 12.7 % (ref 11.5–15.5)

## 2010-09-04 LAB — GC/CHLAMYDIA PROBE AMP, GENITAL: GC Probe Amp, Genital: NEGATIVE

## 2010-09-08 ENCOUNTER — Encounter: Payer: Self-pay | Admitting: Family Medicine

## 2011-02-25 ENCOUNTER — Ambulatory Visit (HOSPITAL_COMMUNITY)
Admission: RE | Admit: 2011-02-25 | Discharge: 2011-02-25 | Disposition: A | Discharge status: Home / Self Care | Payer: BC Managed Care – PPO | Source: Ambulatory Visit | Attending: Family Medicine | Admitting: Family Medicine

## 2011-02-25 ENCOUNTER — Ambulatory Visit (INDEPENDENT_AMBULATORY_CARE_PROVIDER_SITE_OTHER): Payer: BC Managed Care – PPO | Admitting: Family Medicine

## 2011-02-25 ENCOUNTER — Other Ambulatory Visit: Payer: Self-pay

## 2011-02-25 ENCOUNTER — Encounter: Payer: Self-pay | Admitting: Family Medicine

## 2011-02-25 DIAGNOSIS — N898 Other specified noninflammatory disorders of vagina: Secondary | ICD-10-CM | POA: Insufficient documentation

## 2011-02-25 DIAGNOSIS — R079 Chest pain, unspecified: Secondary | ICD-10-CM | POA: Insufficient documentation

## 2011-02-25 DIAGNOSIS — R109 Unspecified abdominal pain: Secondary | ICD-10-CM | POA: Insufficient documentation

## 2011-02-25 MED ORDER — METRONIDAZOLE 500 MG PO TABS: 500.0000 mg | ORAL_TABLET | Freq: Two times a day (BID) | ORAL | Status: AC

## 2011-02-25 NOTE — Progress Notes (Signed)
The patient presents to clinic today for abdominal pain.  This is been going on for several months initially rarely and intermittently, however currently it happens daily.  She has pain in the lower quadrant of her abdomen in the upper right quadrant.  Occasionally she has substernal pain brief sharp nonexertional and nonradiating.   She also occasionally has shortness of breath sometimes with exertion.  She denies any change to her bowel or bladder, and she does note white vaginal discharge without itching. She feels well otherwise.  PMH reviewed.  ROS as above otherwise neg  Exam:  BP 123/82  Pulse 88  Temp(Src) 98.3 F (36.8 C) (Oral)  Wt 186 lb 3.2 oz (84.46 kg)  LMP 02/22/2011 Gen: Well NAD HEENT: EOMI,  MMM Lungs: CTABL Nl WOB Heart: RRR no MRG Abd: NABS, NT, ND Exts: Non edematous BL  LE, warm and well perfused.   EKG: Normal sinus rhythm at 68 beats per minute completely normal.

## 2011-02-25 NOTE — Patient Instructions (Signed)
Thank you for coming in today. I'm treating her vaginal discharge with metronidazole.   Take this medicine twice a day for one week. Don't drink while taking this medicine as it will make you sick Come back in 2 weeks if not feeling better and will take a closer look. Let me know about chest pain worsens or you have trouble breathing that lasts longer than a minute or so

## 2011-02-25 NOTE — Assessment & Plan Note (Signed)
Unfortunately we're unable to do wet prep today.  Therefore am treating empirically with metronidazole for bacterial vaginosis. We will followup if not improved will do a internal exam with wet prep at the next visit.

## 2011-02-25 NOTE — Assessment & Plan Note (Addendum)
I am not sure of the etiology of this.  However her belly exam was completely benign and she denies any red flag signs or symptoms.  Plan to treat vaginal discharge and will followup if that did not improve her abdominal pain.  If pain continues  I'll obtain a comprehensive metabolic panel and  upright x-ray of her abdomen.  Chest pain I think is related to her abdominal pain very atypical no red flags  EKG is normal

## 2011-05-17 LAB — OB RESULTS CONSOLE HIV ANTIBODY (ROUTINE TESTING): HIV: NONREACTIVE

## 2011-05-17 LAB — OB RESULTS CONSOLE RPR: RPR: NONREACTIVE

## 2011-05-17 LAB — OB RESULTS CONSOLE RUBELLA ANTIBODY, IGM: Rubella: IMMUNE

## 2011-05-17 LAB — OB RESULTS CONSOLE HEPATITIS B SURFACE ANTIGEN: Hepatitis B Surface Ag: NEGATIVE

## 2011-09-24 ENCOUNTER — Emergency Department (INDEPENDENT_AMBULATORY_CARE_PROVIDER_SITE_OTHER)
Admission: EM | Admit: 2011-09-24 | Discharge: 2011-09-24 | Disposition: A | Discharge status: Home Health | Payer: BC Managed Care – PPO | Source: Home / Self Care | Attending: Emergency Medicine | Admitting: Emergency Medicine

## 2011-09-24 ENCOUNTER — Encounter (HOSPITAL_COMMUNITY): Payer: Self-pay

## 2011-09-24 DIAGNOSIS — J069 Acute upper respiratory infection, unspecified: Secondary | ICD-10-CM

## 2011-09-24 MED ORDER — FEXOFENADINE-PSEUDOEPHED ER 60-120 MG PO TB12: 1.0000 | ORAL_TABLET | Freq: Two times a day (BID) | ORAL | Status: DC

## 2011-09-24 MED ORDER — BENZONATATE 200 MG PO CAPS: 200.0000 mg | ORAL_CAPSULE | Freq: Three times a day (TID) | ORAL | Status: AC | PRN

## 2011-09-24 NOTE — ED Notes (Signed)
Pt c/o URI SX onset Tuesday.  Pt states she has been using Tylenol at home with no relief.  Pt states cough is productive with white sputum.

## 2011-09-24 NOTE — ED Provider Notes (Signed)
Chief Complaint  Patient presents with  . URI    History of Present Illness:   The patient is a 36 year old female who has had a four-day history of cough productive of white sputum, nasal congestion with clear to white rhinorrhea, her chest hurts when she coughs, she had a fever at onset of symptoms, and some sore throat. She denies any recent fevers, wheezing, chest tightness, shortness of breath, headache, facial pain, nausea, vomiting, or diarrhea.  Review of Systems:  Other than noted above, the patient denies any of the following symptoms. Systemic:  No fever, chills, sweats, fatigue, myalgias, headache, or anorexia. Eye:  No redness, pain or drainage. ENT:  No earache, nasal congestion, rhinorrhea, sinus pressure, or sore throat. Lungs:  No cough, sputum production, wheezing, shortness of breath. Or chest pain. GI:  No nausea, vomiting, abdominal pain or diarrhea. Skin:  No rash or itching.  PMFSH:  Past medical history, family history, social history, meds, and allergies were reviewed.  Physical Exam:   Vital signs:  BP 118/79  Pulse 99  Temp(Src) 98.5 F (36.9 C) (Oral)  Resp 18  SpO2 99%  LMP 03/23/2011 General:  Alert, in no distress. Eye:  No conjunctival injection or drainage. ENT:  TMs and canals were normal, without erythema or inflammation.  Nasal mucosa was congested, without drainage.  Mucous membranes were moist.  Pharynx was erythematous, without exudate or drainage.  There were no oral ulcerations or lesions. Neck:  Supple, no adenopathy, tenderness or mass. Lungs:  No respiratory distress.  Lungs were clear to auscultation, without wheezes, rales or rhonchi.  Breath sounds were clear and equal bilaterally. Heart:  Regular rhythm, without gallops, murmers or rubs. Skin:  Clear, warm, and dry, without rash or lesions.  Assessment:  The encounter diagnosis was Viral upper respiratory infection.  Plan:   1.  The following meds were prescribed:   New  Prescriptions   BENZONATATE (TESSALON) 200 MG CAPSULE    Take 1 capsule (200 mg total) by mouth 3 (three) times daily as needed for cough.   FEXOFENADINE-PSEUDOEPHEDRINE (ALLEGRA-D) 60-120 MG PER TABLET    Take 1 tablet by mouth every 12 (twelve) hours.   2.  The patient was instructed in symptomatic care and handouts were given. 3.  The patient was told to return if becoming worse in any way, if no better in 3 or 4 days, and given some red flag symptoms that would indicate earlier return.   Reuben Likes, MD 09/24/11 [phone]

## 2011-09-24 NOTE — Discharge Instructions (Signed)

## 2011-11-10 ENCOUNTER — Inpatient Hospital Stay (HOSPITAL_COMMUNITY)
Admission: AD | Admit: 2011-11-10 | Discharge: 2011-11-10 | Disposition: A | Discharge status: Home / Self Care | Payer: BC Managed Care – PPO | Source: Ambulatory Visit | Attending: Obstetrics and Gynecology | Admitting: Obstetrics and Gynecology

## 2011-11-10 ENCOUNTER — Encounter (HOSPITAL_COMMUNITY): Payer: Self-pay

## 2011-11-10 DIAGNOSIS — O479 False labor, unspecified: Secondary | ICD-10-CM

## 2011-11-10 DIAGNOSIS — O4700 False labor before 37 completed weeks of gestation, unspecified trimester: Secondary | ICD-10-CM | POA: Insufficient documentation

## 2011-11-10 LAB — OB RESULTS CONSOLE GC/CHLAMYDIA
Chlamydia: NEGATIVE
Gonorrhea: NEGATIVE

## 2011-11-10 NOTE — MAU Note (Signed)
Pt sent over from office for ptl eval.  Reports cramping since this morning, every 5 minutes.  States pains have now subsided.  Denies any bleeding or leaking of fluid.  + FM.

## 2011-11-10 NOTE — MAU Note (Signed)
Patient sent from the office for evaluation of preterm contractions. Denies any bleeding or leaking and reports good fetal movement.

## 2011-11-10 NOTE — MAU Provider Note (Signed)
Chief Complaint:  Labor Eval   First Provider Initiated Contact with Patient 11/10/11 1328      HPI  Crystal Wilcox is a 36 y.o. G2P0010 at [redacted]w[redacted]d sent to MAU from the office for preterm labor eval.  Denies leakage of fluid or vaginal bleeding. Good fetal movement.   Pregnancy Course: uncomplicated  Past Medical History: Past Medical History  Diagnosis Date  . GERD (gastroesophageal reflux disease)   . Malaria     Hx of Malaria  . Abortion history     G0P0010  . Pap smear abnormality of cervix with ASCUS favoring benign     Colposcopy 10/16/09 - Normal    Past Surgical History: Past Surgical History  Procedure Date  . Dilation and curettage of uterus     Family History: Family History  Problem Relation Age of Onset  . Diabetes Father   . Anesthesia problems Neg Hx     Social History: History  Substance Use Topics  . Smoking status: Never Smoker   . Smokeless tobacco: Never Used  . Alcohol Use: No    Allergies: No Known Allergies  Meds:  Prescriptions prior to admission  Medication Sig Dispense Refill  . ferrous sulfate 325 (65 FE) MG tablet Take 325 mg by mouth daily with breakfast.      . Prenatal Vit-Fe Fumarate-FA (PRENATAL MULTIVITAMIN) TABS Take 1 tablet by mouth at bedtime.          Physical Exam  Blood pressure 111/76, pulse 84, temperature 98.9 F (37.2 C), temperature source Oral, resp. rate 20, height 5\' 2"  (1.575 m), weight 98.975 kg (218 lb 3.2 oz), last menstrual period 02/22/2011, SpO2 100.00%. GENERAL: Well-developed, well-nourished female in no acute distress.  HEENT: normocephalic HEART: normal rate RESP: normal effort ABDOMEN: Soft, nontender, nondistended, gravid.  EXTREMITIES: Nontender, no edema NEURO: alert and oriented  SPECULUM EXAM: Dilation: Fingertip (internal os closed) Effacement (%): Thick Cervical Position: Posterior Station: -2 Exam by:: Cletis Media, RN  FHT:  Baseline 140 , moderate variability, accelerations  present, no decelerations Contractions: q 4-5 mins, painless   Labs: Results for orders placed during the hospital encounter of 11/10/11 (from the past 24 hour(s))  FETAL FIBRONECTIN     Status: Normal   Collection Time   11/10/11  1:00 PM      Component Value Range   Fetal Fibronectin NEGATIVE  NEGATIVE    UC's resolved spontanenously  Imaging:  NA  Assessment: 1. Preterm contractions    Plan: D/C home PTL precautions FKCs Follow-up Information    Follow up with GREWAL,MICHELLE L, MD. (as scheduled or MAU as needed if symptoms worsen)    Contact information:   [address] [place] [address] [phone]         Medication List  As of 11/10/2011  9:41 PM   CONTINUE taking these medications         ferrous sulfate 325 (65 FE) MG tablet      prenatal multivitamin Tabs           [name] 5/22/20131:48 PM

## 2011-12-09 ENCOUNTER — Encounter (HOSPITAL_COMMUNITY): Payer: Self-pay | Admitting: *Deleted

## 2011-12-09 ENCOUNTER — Inpatient Hospital Stay (HOSPITAL_COMMUNITY)
Admission: AD | Admit: 2011-12-09 | Discharge: 2011-12-10 | Disposition: A | Discharge status: Home / Self Care | Payer: BC Managed Care – PPO | Source: Ambulatory Visit | Attending: Obstetrics and Gynecology | Admitting: Obstetrics and Gynecology

## 2011-12-09 DIAGNOSIS — O479 False labor, unspecified: Secondary | ICD-10-CM | POA: Insufficient documentation

## 2011-12-09 DIAGNOSIS — O4690 Antepartum hemorrhage, unspecified, unspecified trimester: Secondary | ICD-10-CM | POA: Insufficient documentation

## 2011-12-09 NOTE — MAU Note (Signed)
Pt reports bleeding, states it was on the tissue, some contractions

## 2011-12-10 ENCOUNTER — Encounter (HOSPITAL_COMMUNITY)
Admission: AD | Disposition: A | Discharge status: Home / Self Care | Payer: Self-pay | Source: Ambulatory Visit | Attending: Obstetrics and Gynecology

## 2011-12-10 ENCOUNTER — Encounter (HOSPITAL_COMMUNITY): Payer: Self-pay | Admitting: Anesthesiology

## 2011-12-10 ENCOUNTER — Encounter (HOSPITAL_COMMUNITY): Payer: Self-pay | Admitting: *Deleted

## 2011-12-10 ENCOUNTER — Inpatient Hospital Stay (HOSPITAL_COMMUNITY)
Admission: AD | Admit: 2011-12-10 | Discharge: 2011-12-13 | DRG: 371 | Disposition: A | Discharge status: Home / Self Care | Payer: BC Managed Care – PPO | Source: Ambulatory Visit | Attending: Obstetrics and Gynecology | Admitting: Obstetrics and Gynecology

## 2011-12-10 ENCOUNTER — Inpatient Hospital Stay (HOSPITAL_COMMUNITY): Payer: BC Managed Care – PPO | Admitting: Anesthesiology

## 2011-12-10 DIAGNOSIS — E663 Overweight: Secondary | ICD-10-CM

## 2011-12-10 DIAGNOSIS — N898 Other specified noninflammatory disorders of vagina: Secondary | ICD-10-CM

## 2011-12-10 DIAGNOSIS — R1013 Epigastric pain: Secondary | ICD-10-CM

## 2011-12-10 DIAGNOSIS — Z22330 Carrier of Group B streptococcus: Secondary | ICD-10-CM

## 2011-12-10 DIAGNOSIS — K219 Gastro-esophageal reflux disease without esophagitis: Secondary | ICD-10-CM

## 2011-12-10 DIAGNOSIS — O99892 Other specified diseases and conditions complicating childbirth: Secondary | ICD-10-CM | POA: Diagnosis present

## 2011-12-10 DIAGNOSIS — Z8613 Personal history of malaria: Secondary | ICD-10-CM

## 2011-12-10 LAB — CBC
HCT: 33 % — ABNORMAL LOW (ref 36.0–46.0)
Hemoglobin: 11.1 g/dL — ABNORMAL LOW (ref 12.0–15.0)
MCH: 30.6 pg (ref 26.0–34.0)
MCV: 90.9 fL (ref 78.0–100.0)
Platelets: 230 10*3/uL (ref 150–400)
RBC: 3.63 MIL/uL — ABNORMAL LOW (ref 3.87–5.11)

## 2011-12-10 LAB — ABO/RH: ABO/RH(D): B POS

## 2011-12-10 LAB — TYPE AND SCREEN

## 2011-12-10 SURGERY — Surgical Case
Anesthesia: Regional | Site: Abdomen | Wound class: Clean Contaminated

## 2011-12-10 MED ORDER — EPHEDRINE SULFATE 50 MG/ML IJ SOLN
INTRAMUSCULAR | Status: AC
  Filled 2011-12-10: qty 1

## 2011-12-10 MED ORDER — OXYTOCIN 40 UNITS IN LACTATED RINGERS INFUSION - SIMPLE MED
1.0000 m[IU]/min | INTRAVENOUS | Status: DC
  Administered 2011-12-10: 2 m[IU]/min via INTRAVENOUS
  Filled 2011-12-10: qty 1000

## 2011-12-10 MED ORDER — MEPERIDINE HCL 25 MG/ML IJ SOLN: 6.2500 mg | INTRAMUSCULAR | Status: DC | PRN

## 2011-12-10 MED ORDER — DIPHENHYDRAMINE HCL 25 MG PO CAPS
25.0000 mg | ORAL_CAPSULE | Freq: Four times a day (QID) | ORAL | Status: DC | PRN
Start: 2011-12-10 — End: 2011-12-13

## 2011-12-10 MED ORDER — IBUPROFEN 600 MG PO TABS: 600.0000 mg | ORAL_TABLET | Freq: Four times a day (QID) | ORAL | Status: DC

## 2011-12-10 MED ORDER — SIMETHICONE 80 MG PO CHEW: 80.0000 mg | CHEWABLE_TABLET | ORAL | Status: DC | PRN

## 2011-12-10 MED ORDER — OXYTOCIN 40 UNITS IN LACTATED RINGERS INFUSION - SIMPLE MED: 62.5000 mL/h | INTRAVENOUS | Status: DC

## 2011-12-10 MED ORDER — LIDOCAINE-EPINEPHRINE (PF) 2 %-1:200000 IJ SOLN
INTRAMUSCULAR | Status: AC
  Filled 2011-12-10: qty 20

## 2011-12-10 MED ORDER — OXYCODONE-ACETAMINOPHEN 5-325 MG PO TABS: 1.0000 | ORAL_TABLET | ORAL | Status: DC | PRN

## 2011-12-10 MED ORDER — LACTATED RINGERS IV SOLN
INTRAVENOUS | Status: DC | PRN
  Administered 2011-12-10: 17:00:00 via INTRAVENOUS

## 2011-12-10 MED ORDER — FENTANYL 2.5 MCG/ML BUPIVACAINE 1/10 % EPIDURAL INFUSION (WH - ANES)
INTRAMUSCULAR | Status: DC | PRN
  Administered 2011-12-10: 14 mL/h via EPIDURAL

## 2011-12-10 MED ORDER — DIPHENHYDRAMINE HCL 50 MG/ML IJ SOLN: 25.0000 mg | INTRAMUSCULAR | Status: DC | PRN

## 2011-12-10 MED ORDER — IBUPROFEN 600 MG PO TABS: 600.0000 mg | ORAL_TABLET | Freq: Four times a day (QID) | ORAL | Status: DC | PRN

## 2011-12-10 MED ORDER — KETOROLAC TROMETHAMINE 60 MG/2ML IM SOLN
INTRAMUSCULAR | Status: AC
  Administered 2011-12-10: 60 mg via INTRAMUSCULAR
  Filled 2011-12-10: qty 2

## 2011-12-10 MED ORDER — ONDANSETRON HCL 4 MG PO TABS: 4.0000 mg | ORAL_TABLET | ORAL | Status: DC | PRN

## 2011-12-10 MED ORDER — NALOXONE HCL 0.4 MG/ML IJ SOLN: 0.4000 mg | INTRAMUSCULAR | Status: DC | PRN

## 2011-12-10 MED ORDER — KETOROLAC TROMETHAMINE 60 MG/2ML IM SOLN
60.0000 mg | Freq: Once | INTRAMUSCULAR | Status: AC | PRN
  Administered 2011-12-10: 60 mg via INTRAMUSCULAR
  Filled 2011-12-10: qty 2

## 2011-12-10 MED ORDER — ONDANSETRON HCL 4 MG/2ML IJ SOLN: 4.0000 mg | Freq: Three times a day (TID) | INTRAMUSCULAR | Status: DC | PRN

## 2011-12-10 MED ORDER — LACTATED RINGERS IV SOLN
INTRAVENOUS | Status: DC | PRN
  Administered 2011-12-10 (×2): via INTRAVENOUS

## 2011-12-10 MED ORDER — SCOPOLAMINE 1 MG/3DAYS TD PT72: 1.0000 | MEDICATED_PATCH | Freq: Once | TRANSDERMAL | Status: DC

## 2011-12-10 MED ORDER — OXYTOCIN 20 UNITS IN LACTATED RINGERS INFUSION - SIMPLE
INTRAVENOUS | Status: DC | PRN
  Administered 2011-12-10: 40 [IU] via INTRAVENOUS

## 2011-12-10 MED ORDER — EPHEDRINE 5 MG/ML INJ
10.0000 mg | INTRAVENOUS | Status: DC | PRN
  Filled 2011-12-10: qty 4

## 2011-12-10 MED ORDER — SENNOSIDES-DOCUSATE SODIUM 8.6-50 MG PO TABS: 2.0000 | ORAL_TABLET | Freq: Every day | ORAL | Status: DC

## 2011-12-10 MED ORDER — MORPHINE SULFATE (PF) 0.5 MG/ML IJ SOLN
INTRAMUSCULAR | Status: DC | PRN
  Administered 2011-12-10: 4 mg via EPIDURAL

## 2011-12-10 MED ORDER — SCOPOLAMINE 1 MG/3DAYS TD PT72
1.0000 | MEDICATED_PATCH | Freq: Once | TRANSDERMAL | Status: DC
  Administered 2011-12-10: 1.5 mg via TRANSDERMAL

## 2011-12-10 MED ORDER — OXYTOCIN 40 UNITS IN LACTATED RINGERS INFUSION - SIMPLE MED: 62.5000 mL/h | Freq: Once | INTRAVENOUS | Status: DC

## 2011-12-10 MED ORDER — KETOROLAC TROMETHAMINE 30 MG/ML IJ SOLN: 30.0000 mg | Freq: Four times a day (QID) | INTRAMUSCULAR | Status: DC | PRN

## 2011-12-10 MED ORDER — DIBUCAINE 1 % RE OINT: 1.0000 "application " | TOPICAL_OINTMENT | RECTAL | Status: DC | PRN

## 2011-12-10 MED ORDER — SODIUM CHLORIDE 0.9 % IJ SOLN: 3.0000 mL | INTRAMUSCULAR | Status: DC | PRN

## 2011-12-10 MED ORDER — BUPIVACAINE HCL (PF) 0.25 % IJ SOLN
INTRAMUSCULAR | Status: AC
  Filled 2011-12-10: qty 30

## 2011-12-10 MED ORDER — MENTHOL 3 MG MT LOZG: 1.0000 | LOZENGE | OROMUCOSAL | Status: DC | PRN

## 2011-12-10 MED ORDER — HYDROMORPHONE HCL PF 1 MG/ML IJ SOLN: 0.2500 mg | INTRAMUSCULAR | Status: DC | PRN

## 2011-12-10 MED ORDER — WITCH HAZEL-GLYCERIN EX PADS: 1.0000 "application " | MEDICATED_PAD | CUTANEOUS | Status: DC | PRN

## 2011-12-10 MED ORDER — DIPHENHYDRAMINE HCL 50 MG/ML IJ SOLN: 12.5000 mg | INTRAMUSCULAR | Status: DC | PRN

## 2011-12-10 MED ORDER — TETANUS-DIPHTH-ACELL PERTUSSIS 5-2.5-18.5 LF-MCG/0.5 IM SUSP: 0.5000 mL | Freq: Once | INTRAMUSCULAR | Status: DC

## 2011-12-10 MED ORDER — NALBUPHINE HCL 10 MG/ML IJ SOLN: 5.0000 mg | INTRAMUSCULAR | Status: DC | PRN

## 2011-12-10 MED ORDER — NALBUPHINE HCL 10 MG/ML IJ SOLN
5.0000 mg | INTRAMUSCULAR | Status: DC | PRN
  Filled 2011-12-10: qty 1

## 2011-12-10 MED ORDER — PHENYLEPHRINE 40 MCG/ML (10ML) SYRINGE FOR IV PUSH (FOR BLOOD PRESSURE SUPPORT)
PREFILLED_SYRINGE | INTRAVENOUS | Status: AC
  Filled 2011-12-10: qty 10

## 2011-12-10 MED ORDER — KETOROLAC TROMETHAMINE 60 MG/2ML IM SOLN
60.0000 mg | Freq: Once | INTRAMUSCULAR | Status: AC | PRN
  Filled 2011-12-10: qty 2

## 2011-12-10 MED ORDER — ZOLPIDEM TARTRATE 5 MG PO TABS: 5.0000 mg | ORAL_TABLET | Freq: Every evening | ORAL | Status: DC | PRN

## 2011-12-10 MED ORDER — ONDANSETRON HCL 4 MG/2ML IJ SOLN
INTRAMUSCULAR | Status: DC | PRN
  Administered 2011-12-10: 4 mg via INTRAVENOUS

## 2011-12-10 MED ORDER — LACTATED RINGERS IV SOLN: INTRAVENOUS | Status: DC | PRN

## 2011-12-10 MED ORDER — SODIUM CHLORIDE 0.9 % IV SOLN
1.0000 g | Freq: Four times a day (QID) | INTRAVENOUS | Status: DC
  Administered 2011-12-10: 1 g via INTRAVENOUS
  Filled 2011-12-10 (×3): qty 1000

## 2011-12-10 MED ORDER — LACTATED RINGERS IV SOLN
INTRAVENOUS | Status: DC
  Administered 2011-12-11 (×2): via INTRAVENOUS

## 2011-12-10 MED ORDER — LANOLIN HYDROUS EX OINT: 1.0000 "application " | TOPICAL_OINTMENT | CUTANEOUS | Status: DC | PRN

## 2011-12-10 MED ORDER — PRENATAL MULTIVITAMIN CH
1.0000 | ORAL_TABLET | Freq: Every day | ORAL | Status: DC
  Administered 2011-12-11 – 2011-12-13 (×3): 1 via ORAL
  Filled 2011-12-10 (×3): qty 1

## 2011-12-10 MED ORDER — LIDOCAINE HCL (PF) 1 % IJ SOLN: 30.0000 mL | INTRAMUSCULAR | Status: DC | PRN

## 2011-12-10 MED ORDER — ONDANSETRON HCL 4 MG/2ML IJ SOLN: 4.0000 mg | INTRAMUSCULAR | Status: DC | PRN

## 2011-12-10 MED ORDER — MORPHINE SULFATE 0.5 MG/ML IJ SOLN
INTRAMUSCULAR | Status: AC
  Filled 2011-12-10: qty 10

## 2011-12-10 MED ORDER — PHENYLEPHRINE 40 MCG/ML (10ML) SYRINGE FOR IV PUSH (FOR BLOOD PRESSURE SUPPORT): 80.0000 ug | PREFILLED_SYRINGE | INTRAVENOUS | Status: DC | PRN

## 2011-12-10 MED ORDER — ONDANSETRON HCL 4 MG/2ML IJ SOLN: 4.0000 mg | Freq: Four times a day (QID) | INTRAMUSCULAR | Status: DC | PRN

## 2011-12-10 MED ORDER — FENTANYL 2.5 MCG/ML BUPIVACAINE 1/10 % EPIDURAL INFUSION (WH - ANES)
14.0000 mL/h | INTRAMUSCULAR | Status: DC
  Administered 2011-12-10: 14 mL/h via EPIDURAL
  Filled 2011-12-10 (×2): qty 60

## 2011-12-10 MED ORDER — TERBUTALINE SULFATE 1 MG/ML IJ SOLN: 0.2500 mg | Freq: Once | INTRAMUSCULAR | Status: DC | PRN

## 2011-12-10 MED ORDER — OXYTOCIN 40 UNITS IN LACTATED RINGERS INFUSION - SIMPLE MED: 62.5000 mL/h | INTRAVENOUS | Status: AC

## 2011-12-10 MED ORDER — PHENYLEPHRINE 40 MCG/ML (10ML) SYRINGE FOR IV PUSH (FOR BLOOD PRESSURE SUPPORT)
80.0000 ug | PREFILLED_SYRINGE | INTRAVENOUS | Status: DC | PRN
  Filled 2011-12-10: qty 5

## 2011-12-10 MED ORDER — CITRIC ACID-SODIUM CITRATE 334-500 MG/5ML PO SOLN
30.0000 mL | ORAL | Status: DC | PRN
  Administered 2011-12-10: 30 mL via ORAL
  Filled 2011-12-10: qty 15

## 2011-12-10 MED ORDER — ACETAMINOPHEN 325 MG PO TABS: 650.0000 mg | ORAL_TABLET | ORAL | Status: DC | PRN

## 2011-12-10 MED ORDER — DIPHENHYDRAMINE HCL 25 MG PO CAPS: 25.0000 mg | ORAL_CAPSULE | ORAL | Status: DC | PRN

## 2011-12-10 MED ORDER — SIMETHICONE 80 MG PO CHEW: 80.0000 mg | CHEWABLE_TABLET | Freq: Three times a day (TID) | ORAL | Status: DC

## 2011-12-10 MED ORDER — FLEET ENEMA 7-19 GM/118ML RE ENEM: 1.0000 | ENEMA | RECTAL | Status: DC | PRN

## 2011-12-10 MED ORDER — IBUPROFEN 600 MG PO TABS
600.0000 mg | ORAL_TABLET | Freq: Four times a day (QID) | ORAL | Status: DC
  Administered 2011-12-11 – 2011-12-13 (×9): 600 mg via ORAL
  Filled 2011-12-10 (×9): qty 1

## 2011-12-10 MED ORDER — LACTATED RINGERS IV SOLN
500.0000 mL | INTRAVENOUS | Status: DC | PRN
  Administered 2011-12-10: 1000 mL via INTRAVENOUS

## 2011-12-10 MED ORDER — ZOLPIDEM TARTRATE 10 MG PO TABS: 10.0000 mg | ORAL_TABLET | Freq: Every day | ORAL | Status: DC

## 2011-12-10 MED ORDER — CEFAZOLIN SODIUM 1-5 GM-% IV SOLN
INTRAVENOUS | Status: AC
  Filled 2011-12-10: qty 50

## 2011-12-10 MED ORDER — DIPHENHYDRAMINE HCL 25 MG PO CAPS
25.0000 mg | ORAL_CAPSULE | ORAL | Status: DC | PRN
  Filled 2011-12-10: qty 1

## 2011-12-10 MED ORDER — ONDANSETRON HCL 4 MG/2ML IJ SOLN
INTRAMUSCULAR | Status: AC
  Filled 2011-12-10: qty 2

## 2011-12-10 MED ORDER — BUPIVACAINE HCL (PF) 0.25 % IJ SOLN
INTRAMUSCULAR | Status: DC | PRN
  Administered 2011-12-10: 10 mL

## 2011-12-10 MED ORDER — SENNOSIDES-DOCUSATE SODIUM 8.6-50 MG PO TABS
2.0000 | ORAL_TABLET | Freq: Every day | ORAL | Status: DC
  Administered 2011-12-12: 2 via ORAL

## 2011-12-10 MED ORDER — PRENATAL MULTIVITAMIN CH: 1.0000 | ORAL_TABLET | Freq: Every day | ORAL | Status: DC

## 2011-12-10 MED ORDER — EPHEDRINE 5 MG/ML INJ: 10.0000 mg | INTRAVENOUS | Status: DC | PRN

## 2011-12-10 MED ORDER — OXYTOCIN 10 UNIT/ML IJ SOLN
INTRAMUSCULAR | Status: AC
  Filled 2011-12-10: qty 4

## 2011-12-10 MED ORDER — NALOXONE HCL 0.4 MG/ML IJ SOLN: 1.0000 ug/kg/h | INTRAMUSCULAR | Status: DC | PRN

## 2011-12-10 MED ORDER — KETOROLAC TROMETHAMINE 30 MG/ML IJ SOLN: 30.0000 mg | Freq: Four times a day (QID) | INTRAMUSCULAR | Status: AC | PRN

## 2011-12-10 MED ORDER — METOCLOPRAMIDE HCL 5 MG/ML IJ SOLN: 10.0000 mg | Freq: Three times a day (TID) | INTRAMUSCULAR | Status: DC | PRN

## 2011-12-10 MED ORDER — LACTATED RINGERS IV SOLN
INTRAVENOUS | Status: DC
  Administered 2011-12-10 (×2): via INTRAVENOUS

## 2011-12-10 MED ORDER — PHENYLEPHRINE HCL 10 MG/ML IJ SOLN
INTRAMUSCULAR | Status: DC | PRN
  Administered 2011-12-10: 80 ug via INTRAVENOUS
  Administered 2011-12-10: 120 ug via INTRAVENOUS

## 2011-12-10 MED ORDER — LACTATED RINGERS IV SOLN: INTRAVENOUS | Status: DC

## 2011-12-10 MED ORDER — EPHEDRINE SULFATE 50 MG/ML IJ SOLN
INTRAMUSCULAR | Status: DC | PRN
  Administered 2011-12-10 (×2): 12.5 mg via INTRAVENOUS

## 2011-12-10 MED ORDER — SCOPOLAMINE 1 MG/3DAYS TD PT72
MEDICATED_PATCH | TRANSDERMAL | Status: AC
  Administered 2011-12-10: 1.5 mg via TRANSDERMAL
  Filled 2011-12-10: qty 1

## 2011-12-10 MED ORDER — SODIUM CHLORIDE 0.9 % IV SOLN: 1.0000 ug/kg/h | INTRAVENOUS | Status: DC | PRN

## 2011-12-10 MED ORDER — SIMETHICONE 80 MG PO CHEW
80.0000 mg | CHEWABLE_TABLET | Freq: Three times a day (TID) | ORAL | Status: DC
  Administered 2011-12-11 – 2011-12-13 (×7): 80 mg via ORAL

## 2011-12-10 MED ORDER — LACTATED RINGERS IV SOLN
500.0000 mL | Freq: Once | INTRAVENOUS | Status: AC
  Administered 2011-12-10: 500 mL via INTRAVENOUS

## 2011-12-10 MED ORDER — OXYTOCIN BOLUS FROM INFUSION
250.0000 mL | Freq: Once | INTRAVENOUS | Status: DC
  Filled 2011-12-10: qty 500

## 2011-12-10 MED ORDER — CEFAZOLIN SODIUM 1-5 GM-% IV SOLN
1.0000 g | INTRAVENOUS | Status: DC
  Administered 2011-12-10: 1 g via INTRAVENOUS

## 2011-12-10 MED ORDER — SODIUM BICARBONATE 8.4 % IV SOLN
INTRAVENOUS | Status: AC
  Filled 2011-12-10: qty 50

## 2011-12-10 MED ORDER — SODIUM BICARBONATE 8.4 % IV SOLN
INTRAVENOUS | Status: DC | PRN
  Administered 2011-12-10: 4 mL via EPIDURAL

## 2011-12-10 MED ORDER — DIPHENHYDRAMINE HCL 25 MG PO CAPS: 25.0000 mg | ORAL_CAPSULE | Freq: Four times a day (QID) | ORAL | Status: DC | PRN

## 2011-12-10 SURGICAL SUPPLY — 32 items
ADH SKN CLS APL DERMABOND .7 (GAUZE/BANDAGES/DRESSINGS) ×1
BARRIER ADHS 3X4 INTERCEED (GAUZE/BANDAGES/DRESSINGS) IMPLANT
BRR ADH 4X3 ABS CNTRL BYND (GAUZE/BANDAGES/DRESSINGS)
CHLORAPREP W/TINT 26ML (MISCELLANEOUS) ×2 IMPLANT
CLOTH BEACON ORANGE TIMEOUT ST (SAFETY) ×2 IMPLANT
CONTAINER PREFILL 10% NBF 15ML (MISCELLANEOUS) IMPLANT
DERMABOND ADVANCED (GAUZE/BANDAGES/DRESSINGS) ×1
DERMABOND ADVANCED .7 DNX12 (GAUZE/BANDAGES/DRESSINGS) IMPLANT
DRSG COVADERM 4X10 (GAUZE/BANDAGES/DRESSINGS) ×1 IMPLANT
ELECT REM PT RETURN 9FT ADLT (ELECTROSURGICAL) ×2
ELECTRODE REM PT RTRN 9FT ADLT (ELECTROSURGICAL) ×1 IMPLANT
EXTRACTOR VACUUM M CUP 4 TUBE (SUCTIONS) IMPLANT
GLOVE BIO SURGEON STRL SZ 6.5 (GLOVE) ×4 IMPLANT
GOWN PREVENTION PLUS LG XLONG (DISPOSABLE) ×6 IMPLANT
KIT ABG SYR 3ML LUER SLIP (SYRINGE) IMPLANT
NDL HYPO 25X5/8 SAFETYGLIDE (NEEDLE) ×1 IMPLANT
NEEDLE HYPO 22GX1.5 SAFETY (NEEDLE) ×2 IMPLANT
NEEDLE HYPO 25X5/8 SAFETYGLIDE (NEEDLE) ×2 IMPLANT
NS IRRIG 1000ML POUR BTL (IV SOLUTION) ×2 IMPLANT
PACK C SECTION WH (CUSTOM PROCEDURE TRAY) ×2 IMPLANT
SLEEVE SCD COMPRESS KNEE MED (MISCELLANEOUS) IMPLANT
STAPLER VISISTAT 35W (STAPLE) IMPLANT
SUT CHROMIC 0 CTX 36 (SUTURE) ×4 IMPLANT
SUT PLAIN 0 NONE (SUTURE) IMPLANT
SUT PLAIN 2 0 XLH (SUTURE) IMPLANT
SUT VIC AB 0 CT1 27 (SUTURE) ×6
SUT VIC AB 0 CT1 27XBRD ANBCTR (SUTURE) ×3 IMPLANT
SUT VIC AB 4-0 KS 27 (SUTURE) IMPLANT
SYR CONTROL 10ML LL (SYRINGE) ×2 IMPLANT
TOWEL OR 17X24 6PK STRL BLUE (TOWEL DISPOSABLE) ×4 IMPLANT
TRAY FOLEY CATH 14FR (SET/KITS/TRAYS/PACK) ×2 IMPLANT
WATER STERILE IRR 1000ML POUR (IV SOLUTION) ×2 IMPLANT

## 2011-12-10 NOTE — Anesthesia Procedure Notes (Signed)

## 2011-12-10 NOTE — Brief Op Note (Signed)
12/10/2011  5:59 PM  PATIENT:  Crystal Wilcox  36 y.o. female  PRE-OPERATIVE DIAGNOSIS:  failure to progress  POST-OPERATIVE DIAGNOSIS:  failure to progress  PROCEDURE:  Procedure(s) (LRB): Primary Low Transverse CESAREAN SECTION (N/A)  SURGEON:  Surgeon(s) and Role:    * Jeani Hawking, MD - Primary  PHYSICIAN ASSISTANT:   ASSISTANTS: none   ANESTHESIA:   epidural  EBL:  Total I/O In: 700 [I.V.:700] Out: 30 [Urine:30]  BLOOD ADMINISTERED:none  DRAINS: Urinary Catheter (Foley)   LOCAL MEDICATIONS USED:  LIDOCAINE   SPECIMEN:  No Specimen  DISPOSITION OF SPECIMEN:  N/A  COUNTS:  YES  TOURNIQUET:  * No tourniquets in log *  DICTATION: .Other Dictation: Dictation Number [id number]  PLAN OF CARE: Admit to inpatient   PATIENT DISPOSITION:  PACU - hemodynamically stable.   Delay start of Pharmacological VTE agent (>24hrs) due to surgical blood loss or risk of bleeding: not applicable

## 2011-12-10 NOTE — Progress Notes (Signed)
Patient has been in good labor pattern all day long. About 2 hours ago she had a 5 to 6 minute decel and now she is on her side and oxygen is on. Fetal heart rate looks ok but variability is a little diminished IUPC is adequate Cervix no change since her epidural  5 cm vertex is still high and her cervix is now swollen I discussed with patient that her labor is not progressing on a normal pattern Recommend primary ltcs Risks reviewed with the patient

## 2011-12-10 NOTE — MAU Note (Signed)
Was here yesterday, dc'd at 0300.  Contractions started yesterday, have gotten stronger and closer.  Bloody mucous yesterday, heavier now.

## 2011-12-10 NOTE — MAU Note (Signed)
Ctx's every 3-85minutes, "really bad", no problems with preg

## 2011-12-10 NOTE — H&P (Signed)
36 year old G 2 P 0010 at 45 w presents in active labor. BOWI. She was loose 1 cm now 3 to 4 cm. PNC see hollister GBBS is positive NKDA  Afebrile  Vital signs stable Fetal heart rate is reactive Toco UCs every 3 minutes General alert and oriented Lung CTAB Car RRR Abdomen is soft and non tender Leopolds 7 pounds Cervix is 90% 3 to 4 -2  Vertex bowi  IMPRESSION: Active Labor   PLAN: Ampicillin Epidural

## 2011-12-10 NOTE — Anesthesia Preprocedure Evaluation (Addendum)
Anesthesia Evaluation  Patient identified by MRN, date of birth, ID band Patient awake    Reviewed: Allergy & Precautions, H&P , Patient's Chart, lab work & pertinent test results  Airway Mallampati: II TM Distance: >3 FB Neck ROM: full    Dental  (+) Teeth Intact   Pulmonary  breath sounds clear to auscultation        Cardiovascular Rhythm:regular Rate:Normal     Neuro/Psych    GI/Hepatic GERD-  ,  Endo/Other  Morbid obesity  Renal/GU      Musculoskeletal   Abdominal   Peds  Hematology   Anesthesia Other Findings       Reproductive/Obstetrics (+) Pregnancy                           Anesthesia Physical Anesthesia Plan  ASA: II  Anesthesia Plan: Epidural   Post-op Pain Management:    Induction:   Airway Management Planned:   Additional Equipment:   Intra-op Plan:   Post-operative Plan:   Informed Consent: I have reviewed the patients History and Physical, chart, labs and discussed the procedure including the risks, benefits and alternatives for the proposed anesthesia with the patient or authorized representative who has indicated his/her understanding and acceptance.   Dental Advisory Given  Plan Discussed with:   Anesthesia Plan Comments: (Labs checked- platelets confirmed with RN in room. Fetal heart tracing, per RN, reported to be stable enough for sitting procedure. Discussed epidural, and patient consents to the procedure:  included risk of possible headache,backache, failed block, allergic reaction, and nerve injury. This patient was asked if she had any questions or concerns before the procedure started. )        Anesthesia Quick Evaluation

## 2011-12-10 NOTE — Anesthesia Postprocedure Evaluation (Signed)
  Anesthesia Post-op Note  Patient: Crystal Wilcox  Procedure(s) Performed: Procedure(s) (LRB): CESAREAN SECTION (N/A)  Patient Location: PACU  Anesthesia Type: Epidural  Level of Consciousness: awake, alert  and oriented  Airway and Oxygen Therapy: Patient Spontanous Breathing  Post-op Pain: none  Post-op Assessment: Post-op Vital signs reviewed, Patient's Cardiovascular Status Stable, Respiratory Function Stable, Patent Airway, No signs of Nausea or vomiting, Pain level controlled, No headache and No backache  Post-op Vital Signs: Reviewed and stable  Complications: No apparent anesthesia complications

## 2011-12-10 NOTE — MAU Note (Signed)
Monitors removed so patient can ambulate in the hall x 1hr per Dr. Henderson Cloud.

## 2011-12-10 NOTE — Discharge Instructions (Signed)
Normal Labor and Delivery Your caregiver must first be sure you are in labor. Signs of labor include:  You may pass what is called "the mucus plug" before labor begins. This is a small amount of blood stained mucus.   Regular uterine contractions.   The time between contractions get closer together.   The discomfort and pain gradually gets more intense.   Pains are mostly located in the back.   Pains get worse when walking.   The cervix (the opening of the uterus becomes thinner (begins to efface) and opens up (dilates).  Once you are in labor and admitted into the hospital or care center, your caregiver will do the following:  A complete physical examination.   Check your vital signs (blood pressure, pulse, temperature and the fetal heart rate).   Do a vaginal examination (using a sterile glove and lubricant) to determine:   The position (presentation) of the baby (head [vertex] or buttock first).   The level (station) of the baby's head in the birth canal.   The effacement and dilatation of the cervix.   You may have your pubic hair shaved and be given an enema depending on your caregiver and the circumstance.   An electronic monitor is usually placed on your abdomen. The monitor follows the length and intensity of the contractions, as well as the baby's heart rate.   Usually, your caregiver will insert an IV in your arm with a bottle of sugar water. This is done as a precaution so that medications can be given to you quickly during labor or delivery.  NORMAL LABOR AND DELIVERY IS DIVIDED UP INTO 3 STAGES: First Stage This is when regular contractions begin and the cervix begins to efface and dilate. This stage can last from 3 to 15 hours. The end of the first stage is when the cervix is 100% effaced and 10 centimeters dilated. Pain medications may be given by   Injection (morphine, demerol, etc.)   Regional anesthesia (spinal, caudal or epidural, anesthetics given in  different locations of the spine). Paracervical pain medication may be given, which is an injection of and anesthetic on each side of the cervix.  A pregnant woman may request to have "Natural Childbirth" which is not to have any medications or anesthesia during her labor and delivery. Second Stage This is when the baby comes down through the birth canal (vagina) and is born. This can take 1 to 4 hours. As the baby's head comes down through the birth canal, you may feel like you are going to have a bowel movement. You will get the urge to bear down and push until the baby is delivered. As the baby's head is being delivered, the caregiver will decide if an episiotomy (a cut in the perineum and vagina area) is needed to prevent tearing of the tissue in this area. The episiotomy is sewn up after the delivery of the baby and placenta. Sometimes a mask with nitrous oxide is given for the mother to breath during the delivery of the baby to help if there is too much pain. The end of Stage 2 is when the baby is fully delivered. Then when the umbilical cord stops pulsating it is clamped and cut. Third Stage The third stage begins after the baby is completely delivered and ends after the placenta (afterbirth) is delivered. This usually takes 5 to 30 minutes. After the placenta is delivered, a medication is given either by intravenous or injection to help contract   the uterus and prevent bleeding. The third stage is not painful and pain medication is usually not necessary. If an episiotomy was done, it is repaired at this time. After the delivery, the mother is watched and monitored closely for 1 to 2 hours to make sure there is no postpartum bleeding (hemorrhage). If there is a lot of bleeding, medication is given to contract the uterus and stop the bleeding. Document Released: 03/16/2008 Document Revised: 05/27/2011 Document Reviewed: 03/16/2008 ExitCare Patient Information 2012 [employer]. 

## 2011-12-10 NOTE — Transfer of Care (Signed)
Immediate Anesthesia Transfer of Care Note  Patient: Crystal Wilcox  Procedure(s) Performed: Procedure(s) (LRB): CESAREAN SECTION (N/A)  Patient Location: PACU  Anesthesia Type: Epidural  Level of Consciousness: awake  Airway & Oxygen Therapy: Patient Spontanous Breathing  Post-op Assessment: Post -op Vital signs reviewed and stable  Post vital signs: Reviewed and stable  Complications: No apparent anesthesia complications

## 2011-12-11 LAB — CBC
Platelets: 186 10*3/uL (ref 150–400)
RBC: 3 MIL/uL — ABNORMAL LOW (ref 3.87–5.11)
RDW: 13.5 % (ref 11.5–15.5)
WBC: 8.8 10*3/uL (ref 4.0–10.5)

## 2011-12-11 MED ORDER — LACTATED RINGERS IV BOLUS (SEPSIS)
1000.0000 mL | Freq: Once | INTRAVENOUS | Status: AC
  Administered 2011-12-11: 1000 mL via INTRAVENOUS

## 2011-12-11 NOTE — Addendum Note (Signed)
Addendum  created 12/11/11 0906 by Christene Lye, CRNA   Modules edited:Charges VN, Notes Section

## 2011-12-11 NOTE — Progress Notes (Signed)
Patient's urine output from 0100-0500 only 75 cc. Patient is taking po fluids well, LR with 40 units of pitocin still infusing. Dr. Vincente Poli called and order received. Will continue to monitor patient.

## 2011-12-11 NOTE — Progress Notes (Signed)
Subjective: Postpartum Day 1 Cesarean Delivery Patient reports incisional pain, tolerating PO and no problems voiding.    Objective: Vital signs in last 24 hours: Temp:  [97.8 F (36.6 C)-98.9 F (37.2 C)] 98.9 F (37.2 C) (06/22 0500) Pulse Rate:  [76-110] 94  (06/22 0500) Resp:  [18-28] 20  (06/22 0500) BP: (82-134)/(37-91) 112/68 mmHg (06/22 0500) SpO2:  [94 %-100 %] 96 % (06/22 0500)  Physical Exam:  General: alert, cooperative and appears stated age Lochia: appropriate Uterine Fundus: firm Incision: healing well, no significant drainage, no dehiscence, no significant erythema DVT Evaluation: No evidence of DVT seen on physical exam.   Basename 12/11/11 0535 12/10/11 1009  HGB 9.3* 11.1*  HCT 27.6* 33.0*    Assessment/Plan: Status post Cesarean section. Doing well postoperatively.  Continue current care.  Heywood Tokunaga L 12/11/2011, 9:21 AM

## 2011-12-11 NOTE — Anesthesia Postprocedure Evaluation (Signed)
  Anesthesia Post-op Note  Patient: Crystal Wilcox  Procedure(s) Performed: Procedure(s) (LRB): CESAREAN SECTION (N/A)  Patient Location: Mother/Baby  Anesthesia Type: Epidural  Level of Consciousness: awake, alert  and oriented  Airway and Oxygen Therapy: Patient Spontanous Breathing  Post-op Pain: none  Post-op Assessment: Post-op Vital signs reviewed, Patient's Cardiovascular Status Stable, No headache, No backache, No residual numbness and No residual motor weakness  Post-op Vital Signs: Reviewed and stable  Complications: No apparent anesthesia complications

## 2011-12-11 NOTE — Op Note (Signed)
Crystal Wilcox, Crystal Wilcox            ACCOUNT NO.:  [id number]  MEDICAL RECORD NO.:  [id number]  LOCATION:  9114                          FACILITY:  [facility]  PHYSICIAN:  Courtney Fenlon L. Graziella Connery, M.D.DATE OF BIRTH:  07-19-1975  DATE OF PROCEDURE:  12/10/2011 DATE OF DISCHARGE:                              OPERATIVE REPORT   PREOPERATIVE DIAGNOSIS:  Failure to progress.  POSTOPERATIVE DIAGNOSIS:  Failure to progress.  PROCEDURE:  Primary low-transverse cesarean section.  SURGEON:  Emyah Roznowski L. Jerl Munyan, MD  ANESTHESIA:  Epidural.  EBL:  Less than 500.  COMPLICATIONS:  None.  PROCEDURE:  The patient was taken to the operating room.  She was then prepped and draped in usual sterile fashion.  A low-transverse incision was made, carried down to the fascia.  Fascia was scored in the midline and extended laterally.  Rectus muscles were separated in the midline. The peritoneum was entered bluntly.  The peritoneal incision was then stretched.  The bladder blade was inserted.  The lower uterine segment was identified and the bladder flap was created digitally and a low- transverse incision was made in the uterus.  The uterus was entered using the hemostat.  There was some meconium fluid that was a moderate in consistency that was particulate.  We delivered the baby easily with a vacuum extractor and to lead the mouth and nose on the abdomen, that was clear.  The baby was delivered easily.  There was a tight nuchal cord x1.  The baby was given to the neonatal team.  Placenta was manually removed, noted to be normal, intact with a three-vessel cord. The uterus was exteriorized and cleared of all clots and debris.  There were some small fibroids noted.  The adnexa were normal.  The uterine incision was closed in 1 layer using 0 chromic in a running locked stitch.  Antibiotics and Pitocin were given.  The uterus was firm.  The uterus was returned to the abdomen.  Irrigation was performed.   The peritoneum was closed using 0 Vicryl.  The fascia was closed using 0 Vicryl starting at each corner meeting in the midline. After irrigation of subcutaneous layer, the skin was closed with a 4-0 Vicryl on subcuticular.  All sponge, lap, and instrument counts were correct x2.  Local was infiltrated and then Dermabond was applied to the incision.  The patient went to recovery room in stable condition.     [name], M.D.     [name]  D:  12/10/2011  T:  12/11/2011  Job:  [id number]

## 2011-12-12 NOTE — Progress Notes (Signed)
Subjective: Postpartum Day 2: Cesarean Delivery Patient reports incisional pain, tolerating PO and no problems voiding.    Objective: Vital signs in last 24 hours: Temp:  [97.7 F (36.5 C)-98.7 F (37.1 C)] 98 F (36.7 C) (06/23 1610) Pulse Rate:  [88-103] 98  (06/23 0637) Resp:  [18-20] 18  (06/23 0637) BP: (99-115)/(67-75) 115/70 mmHg (06/23 0637) SpO2:  [95 %-97 %] 97 % (06/22 1700)  Physical Exam:  General: alert, cooperative and appears stated age Lochia: appropriate Uterine Fundus: firm Incision: healing well, no significant drainage, no dehiscence, no significant erythema DVT Evaluation: No evidence of DVT seen on physical exam.   Basename 12/11/11 0535 12/10/11 1009  HGB 9.3* 11.1*  HCT 27.6* 33.0*    Assessment/Plan: Status post Cesarean section. Doing well postoperatively.  Continue current care.  Mauri Temkin L 12/12/2011, 7:51 AM

## 2011-12-13 ENCOUNTER — Encounter (HOSPITAL_COMMUNITY): Payer: Self-pay | Admitting: Obstetrics and Gynecology

## 2011-12-13 MED ORDER — IBUPROFEN 600 MG PO TABS: 600.0000 mg | ORAL_TABLET | Freq: Four times a day (QID) | ORAL | Status: AC

## 2011-12-13 MED ORDER — OXYCODONE-ACETAMINOPHEN 5-325 MG PO TABS: 1.0000 | ORAL_TABLET | ORAL | Status: AC | PRN

## 2011-12-13 NOTE — Discharge Summary (Signed)
Obstetric Discharge Summary Reason for Admission: onset of labor Prenatal Procedures: ultrasound Intrapartum Procedures: cesarean: low cervical, transverse Postpartum Procedures: none Complications-Operative and Postpartum: none Hemoglobin  Date Value Range Status  12/11/2011 9.3* 12.0 - 15.0 g/dL Final     HCT  Date Value Range Status  12/11/2011 27.6* 36.0 - 46.0 % Final    Physical Exam:  General: alert and cooperative Lochia: appropriate Uterine Fundus: firm Incision: healing well DVT Evaluation: No evidence of DVT seen on physical exam.  Discharge Diagnoses: Term Pregnancy-delivered  Discharge Information: Date: 12/13/2011 Activity: pelvic rest Diet: routine Medications: PNV, Ibuprofen and Percocet Condition: stable Instructions: refer to practice specific booklet Discharge to: home   Newborn Data: Live born female  Birth Weight: 6 lb 1.5 oz (2765 g) APGAR: 8, 9  Home with mother.  Jmari Pelc G 12/13/2011, 8:37 AM

## 2011-12-13 NOTE — Progress Notes (Signed)
Subjective: Postpartum Day 3: Cesarean Delivery Patient reports tolerating PO, + BM and no problems voiding.    Objective: Vital signs in last 24 hours: Temp:  [97.8 F (36.6 C)-98.5 F (36.9 C)] 98.5 F (36.9 C) (06/24 0520) Pulse Rate:  [86-109] 86  (06/24 0520) Resp:  [18-20] 18  (06/24 0520) BP: (109-117)/(79-80) 117/80 mmHg (06/24 0520)  Physical Exam:  General: alert and cooperative Lochia: appropriate Uterine Fundus: firm Incision: healing well DVT Evaluation: No evidence of DVT seen on physical exam.   Basename 12/11/11 0535 12/10/11 1009  HGB 9.3* 11.1*  HCT 27.6* 33.0*    Assessment/Plan: Status post Cesarean section. Doing well postoperatively.  Discharge home with standard precautions and return to clinic in 1 week.  Kayana Thoen G 12/13/2011, 8:30 AM

## 2012-02-11 ENCOUNTER — Ambulatory Visit (INDEPENDENT_AMBULATORY_CARE_PROVIDER_SITE_OTHER): Payer: BC Managed Care – PPO | Admitting: Family Medicine

## 2012-02-11 ENCOUNTER — Encounter: Payer: Self-pay | Admitting: Family Medicine

## 2012-02-11 VITALS — BP 106/88 | HR 77 | Temp 98.8°F | Ht 64.0 in | Wt 194.8 lb

## 2012-02-11 DIAGNOSIS — O165 Unspecified maternal hypertension, complicating the puerperium: Secondary | ICD-10-CM

## 2012-02-11 LAB — CBC WITH DIFFERENTIAL/PLATELET
Eosinophils Absolute: 0.3 10*3/uL (ref 0.0–0.7)
Eosinophils Relative: 5 % (ref 0–5)
Hemoglobin: 13.1 g/dL (ref 12.0–15.0)
Lymphs Abs: 2.2 10*3/uL (ref 0.7–4.0)
MCH: 29.4 pg (ref 26.0–34.0)
MCV: 87.9 fL (ref 78.0–100.0)
Monocytes Relative: 5 % (ref 3–12)
RBC: 4.45 MIL/uL (ref 3.87–5.11)

## 2012-02-11 LAB — COMPREHENSIVE METABOLIC PANEL
CO2: 27 mEq/L (ref 19–32)
Creat: 0.83 mg/dL (ref 0.50–1.10)
Glucose, Bld: 76 mg/dL (ref 70–99)
Sodium: 140 mEq/L (ref 135–145)
Total Bilirubin: 0.3 mg/dL (ref 0.3–1.2)
Total Protein: 7.2 g/dL (ref 6.0–8.3)

## 2012-02-11 NOTE — Progress Notes (Signed)
Patient ID: Crystal Wilcox, female   DOB: [birth year], 36 y.o.   MRN: [id number] Subjective: The patient is a 36 y.o. year old female who presents today for elevated bp.  No problems during pregnancy.  First noticed 2 days ago at [facility].  Does have occasional blurred vision.  Occasional headaches.  No problems with bp before.  No n/v/d.  No bleeding.  Breast feeding.  Going well.  Patient's past medical, social, and family history were reviewed and updated as appropriate. History  Substance Use Topics  . Smoking status: Never Smoker   . Smokeless tobacco: Never Used  . Alcohol Use: No   Objective:  Filed Vitals:   02/11/12 0953  BP: 140/94  Pulse: 77  Temp: 98.8 F (37.1 C)   Gen: NAD CV: RRR Resp: CTABL Abd: SNTND, no organomegaly Ext: No edema  Assessment/Plan: Repeat BP is much better.  Chances of postpartum pre-ecclampsia is low.  Will obtain CBC and CMET to make certain not early HEELP.  RTC in 1 week for recheck but expect will end up normalizing shortly.  Please also see individual problems in problem list for problem-specific plans.

## 2012-02-11 NOTE — Patient Instructions (Signed)
It was good to see you today! We will get labs today.  If I am concerned by any of the results I will let you know right away. Come back to see [place] Wednesday or Thursday of this coming week.

## 2012-02-14 ENCOUNTER — Encounter: Payer: Self-pay | Admitting: Family Medicine

## 2012-02-16 ENCOUNTER — Ambulatory Visit: Payer: BC Managed Care – PPO | Admitting: Family Medicine

## 2012-03-01 ENCOUNTER — Telehealth: Payer: Self-pay | Admitting: Family Medicine

## 2012-03-01 NOTE — Telephone Encounter (Signed)
Received paperwork to be filled out for patients place of employment.  Will need OV before paperwork can be completed. Please let patient know  Thanks!

## 2012-03-02 NOTE — Telephone Encounter (Signed)
Called patient and told her she will need an office visit to have paper work filled out, she says she will call back to schedule that appointment when she is available to come in to be seen.Busick, Rodena Medin

## 2012-03-07 ENCOUNTER — Ambulatory Visit (INDEPENDENT_AMBULATORY_CARE_PROVIDER_SITE_OTHER): Payer: BC Managed Care – PPO | Admitting: Family Medicine

## 2012-03-07 VITALS — BP 146/93 | HR 77 | Temp 98.2°F | Ht 65.0 in | Wt 200.3 lb

## 2012-03-07 DIAGNOSIS — E669 Obesity, unspecified: Secondary | ICD-10-CM

## 2012-03-07 DIAGNOSIS — M654 Radial styloid tenosynovitis [de Quervain]: Secondary | ICD-10-CM | POA: Insufficient documentation

## 2012-03-07 DIAGNOSIS — Z23 Encounter for immunization: Secondary | ICD-10-CM

## 2012-03-07 DIAGNOSIS — E663 Overweight: Secondary | ICD-10-CM

## 2012-03-07 LAB — LIPID PANEL
Cholesterol: 192 mg/dL (ref 0–200)
Triglycerides: 222 mg/dL — ABNORMAL HIGH (ref <150)
VLDL: 44 mg/dL — ABNORMAL HIGH (ref 0–40)

## 2012-03-07 LAB — COMPREHENSIVE METABOLIC PANEL
AST: 14 U/L (ref 0–37)
BUN: 12 mg/dL (ref 6–23)
Calcium: 9.4 mg/dL (ref 8.4–10.5)
Chloride: 104 mEq/L (ref 96–112)
Creat: 0.96 mg/dL (ref 0.50–1.10)
Glucose, Bld: 70 mg/dL (ref 70–99)

## 2012-03-07 MED ORDER — DICLOFENAC SODIUM 1 % TD GEL: TRANSDERMAL | Status: DC

## 2012-03-07 NOTE — Patient Instructions (Addendum)
De Quervain's Tenosynovitis De Quervain's tenosynovitis involves inflammation of one or two tendon linings (sheaths) or strain of one or two tendons to the thumb: extensor pollicis brevis (EPB), or abductor pollicis longus (APL). This causes pain on the side of the wrist and base of the thumb. Tendon sheaths secrete a fluid that lubricates the tendon, allowing the tendon to move smoothly. When the sheath becomes inflamed, the tendon cannot move freely in the sheath. Both the EPB and APL tendons are important for proper use of the hand. The EPB tendon is important for straightening the thumb. The APL tendon is important for moving the thumb away from the index finger (abducting). The two tendons pass through a small tube (canal) in the wrist, near the base of the thumb. When the tendons become inflamed, pain is usually felt in this area. SYMPTOMS   Pain, tenderness, swelling, warmth, or redness over the base of the thumb and thumb side of the wrist.   Pain that gets worse when straightening the thumb.   Pain that gets worse when moving the thumb away from the index finger, against resistance.   Pain with pinching or gripping.   Locking or catching of the thumb.   Limited motion of the thumb.   Crackling sound (crepitation) when the tendon or thumb is moved or touched.   Fluid-filled cyst in the area of the base of the thumb.  CAUSES   Tenosynovitis is often linked with overuse of the wrist.   Tenosynovitis may be caused by repeated injury to the thumb muscle and tendon units, and with repeated motions of the hand and wrist, due to friction of the tendon within the lining (sheath).   Tenosynovitis may also be due to a sudden increase in activity or change in activity.  RISK INCREASES WITH:  Sports that involve repeated hand and wrist motions (golf, bowling, tennis, squash, racquetball).   Heavy labor.   Poor physical wrist strength and flexibility.   Failure to warm up properly before  practice or play.   Female gender.   New mothers who hold their baby's head for long periods or lift infants with thumbs in the infant's armpit (axilla).  PREVENTION  Warm up and stretch properly before practice or competition.   Allow enough time for rest and recovery between practices and competition.   Maintain appropriate conditioning:   Cardiovascular fitness.   Forearm, wrist, and hand flexibility.   Muscle strength and endurance.   Use proper exercise technique.  PROGNOSIS  This condition is usually curable within 6 weeks, if treated properly with non-surgical treatment and resting of the affected area.  RELATED COMPLICATIONS   Longer healing time if not properly treated or if not given enough time to heal.   Chronic inflammation, causing recurring symptoms of tenosynovitis. Permanent pain or restriction of movement.   Risks of surgery: infection, bleeding, injury to nerves (numbness of the thumb), continued pain, incomplete release of the tendon sheath, recurring symptoms, cutting of the tendons, tendons sliding out of position, weakness of the thumb, thumb stiffness.  TREATMENT  First, treatment involves the use of medicine and ice, to reduce pain and inflammation. Patients are encouraged to stop or modify activities that aggravate the injury. Stretching and strengthening exercises may be advised. Exercises may be completed at home or with a therapist. You may be fitted with a brace or splint, to limit motion and allow the injury to heal. Your caregiver may also choose to give you a corticosteroid injection, to   reduce the pain and inflammation. If non-surgical treatment is not successful, surgery may be needed. Most tenosynovitis surgeries are done as outpatient procedures (you go home the same day). Surgery may involve local, regional (whole arm), or general anesthesia.  MEDICATION   If pain medicine is needed, nonsteroidal anti-inflammatory medicines (aspirin and  ibuprofen), or other minor pain relievers (acetaminophen), are often advised.   Do not take pain medicine for 7 days before surgery.   Prescription pain relievers are often prescribed only after surgery. Use only as directed and only as much as you need.   Corticosteroid injections may be given if your caregiver thinks they are needed. There is a limited number of times these injections may be given.  COLD THERAPY   Cold treatment (icing) should be applied for 10 to 15 minutes every 2 to 3 hours for inflammation and pain, and immediately after activity that aggravates your symptoms. Use ice packs or an ice massage.  SEEK MEDICAL CARE IF:   Symptoms get worse or do not improve in 2 to 4 weeks, despite treatment.   You experience pain, numbness, or coldness in the hand.   Blue, gray, or dark color appears in the fingernails.   Any of the following occur after surgery: increased pain, swelling, redness, drainage of fluids, bleeding in the affected area, or signs of infection.   New, unexplained symptoms develop. (Drugs used in treatment may produce side effects.)  Document Released: 06/07/2005 Document Revised: 05/27/2011 Document Reviewed: 09/19/2008 Exit[employer] Patient Information 2012 [employer]. 

## 2012-03-08 ENCOUNTER — Telehealth: Payer: Self-pay | Admitting: *Deleted

## 2012-03-08 NOTE — Assessment & Plan Note (Signed)
Exam consistent with tenosynovitis bilaterally, will give rx for voltaren gel. F/u with me in 4-6 weeks.

## 2012-03-08 NOTE — Progress Notes (Signed)
  Subjective:    Patient ID: Crystal Wilcox, female    DOB: 09-11-1975, 36 y.o.   MRN: [id number]  HPI  1. Lab work:  Needs lab work for her insurance at work.  They are requesting kidney/liver function and lipid profile.  No history of high cholesterol, liver or kidney dysfunction.    2.  Thumb pain:  Has had pain in bilateral thumbs for the past couple of weeks.  Started out as mild pain during pregnancy in R thumb.  Now in both thumbs.  Pain is along radial side of wrist and thumb.  Made worse when carrying infant car seat.  Has not tried anything for pain  Review of Systems Per HPI    Objective:   Physical Exam  Constitutional: She appears well-nourished. No distress.  Cardiovascular: Normal rate and regular rhythm.   Musculoskeletal:       She has some mild tenderness over the radial styloid and Positive finkelstein test bilaterally.           Assessment & Plan:

## 2012-03-08 NOTE — Telephone Encounter (Addendum)
PA required for Voltaren gel. Form placed in Dr. Ashley Royalty box.

## 2012-03-08 NOTE — Assessment & Plan Note (Signed)
Check lipids, cmet for company insurance.

## 2012-03-10 NOTE — Telephone Encounter (Signed)
Form faxed to insurance 

## 2012-03-10 NOTE — Telephone Encounter (Signed)
Received fax from  insurance. They need additional information. Form placed in MD box.

## 2012-03-14 NOTE — Telephone Encounter (Signed)
Additional information faxed to insurance company

## 2012-03-15 ENCOUNTER — Telehealth: Payer: Self-pay | Admitting: Family Medicine

## 2012-03-15 NOTE — Telephone Encounter (Signed)
Called patient and left VM.  Voltaren gel denied.  On message talked about two other options including OTC aspercreme or ketoprofen gel.  Told her if she wants ketoprofen I can print a prescription and give her instructions on where she can get this compounded.  Also wanted to discuss recent labs with her.  Triglycerides elevated, but not fasting so not too worried about that.  She can improve this by decreasing fat intake in her diet and exercising.  The rest of her labs look great and I will send a copy over to her company.

## 2012-03-16 MED ORDER — DICLOFENAC SODIUM 1 % TD GEL: TRANSDERMAL | Status: DC

## 2012-03-16 NOTE — Telephone Encounter (Signed)
Pharmacy notified  that patient wants to pick up Rx for Voltaren gel. Message left on patient's voicemail that RX is $50.59. Will send message to Dr. Ashley Royalty about the prenatal vitamins.

## 2012-03-16 NOTE — Telephone Encounter (Addendum)
Pt is asking for doctor to send in rx again for the voltaren gel - she is willing to pay full price for it. Also asking for prenatal vitamins [facility] [address]

## 2012-03-16 NOTE — Addendum Note (Signed)
Addended by: Everrett Coombe on: 03/16/2012 01:42 PM   Modules accepted: Orders

## 2012-03-22 ENCOUNTER — Telehealth: Payer: Self-pay | Admitting: Family Medicine

## 2012-03-22 NOTE — Telephone Encounter (Signed)
Is asking about medical form that was faxed a couple of weeks ago - she has to have it within the next 2 days or she will get in trouble.  Needs to know something today -  (not in Dr Ashley Royalty box)

## 2012-03-22 NOTE — Telephone Encounter (Signed)
Called pt. Re-faxed form and told the pt to come by to pick up the original. We will not fax a 3rd time. Lorenda Hatchet, Renato Battles

## 2012-05-15 ENCOUNTER — Telehealth: Payer: Self-pay | Admitting: Family Medicine

## 2012-05-15 NOTE — Telephone Encounter (Signed)
Called pt. Pt said, that her insurance did not pay for her labs from 03/07/12 because of the coding Dr.Matthews used. She request to change the code so her insurance would pay for it. Thank you. Lorenda Hatchet, Renato Battles

## 2012-05-15 NOTE — Telephone Encounter (Signed)
Patient is calling to speak to her doctor about some tests that were ordered, her insurance refused to pay for on 9/17.

## 2012-05-23 NOTE — Telephone Encounter (Signed)
Coded for obesity which should cover both cmet and lipids

## 2012-05-29 ENCOUNTER — Emergency Department (HOSPITAL_COMMUNITY)
Admission: EM | Admit: 2012-05-29 | Discharge: 2012-05-29 | Disposition: A | Discharge status: Home / Self Care | Payer: BC Managed Care – PPO | Source: Home / Self Care | Attending: Family Medicine | Admitting: Family Medicine

## 2012-05-29 ENCOUNTER — Encounter (HOSPITAL_COMMUNITY): Payer: Self-pay | Admitting: Emergency Medicine

## 2012-05-29 DIAGNOSIS — J069 Acute upper respiratory infection, unspecified: Secondary | ICD-10-CM

## 2012-05-29 MED ORDER — FEXOFENADINE-PSEUDOEPHED ER 60-120 MG PO TB12: 1.0000 | ORAL_TABLET | Freq: Two times a day (BID) | ORAL | Status: DC

## 2012-05-29 NOTE — ED Provider Notes (Signed)
Medical screening examination/treatment/procedure(s) were performed by resident physician or non-physician practitioner and as supervising physician I was immediately available for consultation/collaboration.   Glora Hulgan DOUGLAS MD.    Reino Lybbert D Tayson Schnelle, MD 05/29/12 1830 

## 2012-05-29 NOTE — ED Notes (Signed)
Patient complains of chills, nasal and chest congestion, cough for 3 x days. Patient states coughing up brownish phlegm. Pt denies fever, nausea, vomiting, diarrhea. Crystal Wilcox

## 2012-05-29 NOTE — ED Provider Notes (Signed)
History     CSN: [id number]  Arrival date & time 05/29/12  1306   First MD Initiated Contact with Patient 05/29/12 1438      Chief Complaint  Patient presents with  . URI    (Consider location/radiation/quality/duration/timing/severity/associated sxs/prior treatment) Patient is a 36 y.o. female presenting with cough. The history is provided by the patient. No language interpreter was used.  Cough This is a new problem. The current episode started more than 2 days ago. The problem occurs constantly. The problem has been gradually worsening. The cough is productive of sputum. There has been no fever. Pertinent negatives include no chest pain, no rhinorrhea and no shortness of breath. She has tried nothing for the symptoms. She is not a smoker. Her past medical history does not include bronchitis or pneumonia.    Past Medical History  Diagnosis Date  . GERD (gastroesophageal reflux disease)   . Malaria     Hx of Malaria  . Abortion history     G0P0010  . Pap smear abnormality of cervix with ASCUS favoring benign     Colposcopy 10/16/09 - Normal    Past Surgical History  Procedure Date  . Dilation and curettage of uterus   . Cesarean section 12/10/2011    Procedure: CESAREAN SECTION;  Surgeon: Jeani Hawking, MD;  Location: [facility];  Service: Gynecology;  Laterality: N/A;    Family History  Problem Relation Age of Onset  . Diabetes Father   . Anesthesia problems Neg Hx   . Other Neg Hx     History  Substance Use Topics  . Smoking status: Never Smoker   . Smokeless tobacco: Never Used  . Alcohol Use: No    OB History    Grav Para Term Preterm Abortions TAB SAB Ect Mult Living   2 1 1  0 1 1 0   1      Review of Systems  HENT: Negative for rhinorrhea.   Respiratory: Positive for cough. Negative for shortness of breath.   Cardiovascular: Negative for chest pain.  All other systems reviewed and are negative.    Allergies  Review of patient's allergies  indicates no known allergies.  Home Medications   Current Outpatient Rx  Name  Route  Sig  Dispense  Refill  . DICLOFENAC SODIUM 1 % TD GEL      Apply thin layer to wrists bilaterally up to every 6 hours as needed for pain   1 Tube   3   . FERROUS SULFATE 325 (65 FE) MG PO TABS   Oral   Take 325 mg by mouth daily with breakfast.         . PRENATAL MULTIVITAMIN CH   Oral   Take 1 tablet by mouth at bedtime.           There were no vitals taken for this visit.  Physical Exam  Nursing note and vitals reviewed. Constitutional: She appears well-developed and well-nourished.  HENT:  Head: Normocephalic.  Right Ear: External ear normal.  Left Ear: External ear normal.  Nose: Nose normal.  Mouth/Throat: Oropharynx is clear and moist.  Eyes: Conjunctivae normal and EOM are normal. Pupils are equal, round, and reactive to light.  Neck: Normal range of motion. Neck supple.  Cardiovascular: Normal rate and normal heart sounds.   Pulmonary/Chest: Effort normal and breath sounds normal.  Abdominal: Soft.  Musculoskeletal: Normal range of motion.  Neurological: She is alert.  Skin: Skin is warm.  Psychiatric: She  has a normal mood and affect.    ED Course  Procedures (including critical care time)  Labs Reviewed - No data to display No results found.   No diagnosis found.    MDM  I suspect viral uri.   Pt request rx for allegra d.   Pt reports it helps more than cough medications        Lonia Skinner [place], [place] 05/29/12 [phone]

## 2014-04-22 ENCOUNTER — Encounter (HOSPITAL_COMMUNITY): Payer: Self-pay | Admitting: Emergency Medicine

## 2021-12-09 ENCOUNTER — Telehealth: Payer: Self-pay | Admitting: Physician Assistant

## 2021-12-09 NOTE — Telephone Encounter (Signed)
Scheduled appt per 6/16 referral. Pt is aware of appt date and time. Pt is aware to arrive 15 mins prior to appt time and to bring and updated insurance card. Pt is aware of appt location.   

## 2022-01-01 ENCOUNTER — Encounter: Payer: Self-pay | Admitting: Physician Assistant

## 2022-01-01 ENCOUNTER — Inpatient Hospital Stay: Payer: Managed Care, Other (non HMO) | Attending: Physician Assistant | Admitting: Physician Assistant

## 2022-01-01 ENCOUNTER — Inpatient Hospital Stay: Payer: Managed Care, Other (non HMO)

## 2022-01-01 ENCOUNTER — Other Ambulatory Visit: Payer: Self-pay

## 2022-01-01 VITALS — BP 124/75 | HR 88 | Temp 97.7°F | Resp 20 | Wt 243.5 lb

## 2022-01-01 DIAGNOSIS — Z79899 Other long term (current) drug therapy: Secondary | ICD-10-CM | POA: Insufficient documentation

## 2022-01-01 DIAGNOSIS — D500 Iron deficiency anemia secondary to blood loss (chronic): Secondary | ICD-10-CM | POA: Insufficient documentation

## 2022-01-01 DIAGNOSIS — N920 Excessive and frequent menstruation with regular cycle: Secondary | ICD-10-CM | POA: Diagnosis not present

## 2022-01-01 LAB — CBC WITH DIFFERENTIAL (CANCER CENTER ONLY)
Abs Immature Granulocytes: 0.01 10*3/uL (ref 0.00–0.07)
Basophils Absolute: 0.1 10*3/uL (ref 0.0–0.1)
Basophils Relative: 1 %
Eosinophils Absolute: 0.1 10*3/uL (ref 0.0–0.5)
Eosinophils Relative: 3 %
HCT: 33.4 % — ABNORMAL LOW (ref 36.0–46.0)
Hemoglobin: 10.6 g/dL — ABNORMAL LOW (ref 12.0–15.0)
Immature Granulocytes: 0 %
Lymphocytes Relative: 36 %
Lymphs Abs: 1.9 10*3/uL (ref 0.7–4.0)
MCH: 26.8 pg (ref 26.0–34.0)
MCHC: 31.7 g/dL (ref 30.0–36.0)
MCV: 84.3 fL (ref 80.0–100.0)
Monocytes Absolute: 0.4 10*3/uL (ref 0.1–1.0)
Monocytes Relative: 7 %
Neutro Abs: 2.9 10*3/uL (ref 1.7–7.7)
Neutrophils Relative %: 53 %
Platelet Count: 266 10*3/uL (ref 150–400)
RBC: 3.96 MIL/uL (ref 3.87–5.11)
RDW: 14.8 % (ref 11.5–15.5)
WBC Count: 5.4 10*3/uL (ref 4.0–10.5)
nRBC: 0 % (ref 0.0–0.2)

## 2022-01-01 LAB — RETIC PANEL
Immature Retic Fract: 13.3 % (ref 2.3–15.9)
RBC.: 3.96 MIL/uL (ref 3.87–5.11)
Retic Count, Absolute: 64.5 10*3/uL (ref 19.0–186.0)
Retic Ct Pct: 1.6 % (ref 0.4–3.1)
Reticulocyte Hemoglobin: 32.2 pg (ref >27.9)

## 2022-01-01 LAB — CMP (CANCER CENTER ONLY)
ALT: 10 U/L (ref 0–44)
AST: 13 U/L — ABNORMAL LOW (ref 15–41)
Albumin: 3.8 g/dL (ref 3.5–5.0)
Alkaline Phosphatase: 53 U/L (ref 38–126)
Anion gap: 5 (ref 5–15)
BUN: 11 mg/dL (ref 6–20)
CO2: 26 mmol/L (ref 22–32)
Calcium: 9.3 mg/dL (ref 8.9–10.3)
Chloride: 108 mmol/L (ref 98–111)
Creatinine: 0.74 mg/dL (ref 0.44–1.00)
GFR, Estimated: >60 mL/min (ref >60)
Glucose, Bld: 104 mg/dL — ABNORMAL HIGH (ref 70–99)
Potassium: 3.7 mmol/L (ref 3.5–5.1)
Sodium: 139 mmol/L (ref 135–145)
Total Bilirubin: 0.2 mg/dL — ABNORMAL LOW (ref 0.3–1.2)
Total Protein: 6.8 g/dL (ref 6.5–8.1)

## 2022-01-01 LAB — IRON AND IRON BINDING CAPACITY (CC-WL,HP ONLY)
Iron: 69 ug/dL (ref 28–170)
Saturation Ratios: 20 % (ref 10.4–31.8)
TIBC: 346 ug/dL (ref 250–450)
UIBC: 277 ug/dL (ref 148–442)

## 2022-01-01 LAB — FERRITIN: Ferritin: 7 ng/mL — ABNORMAL LOW (ref 11–307)

## 2022-01-03 DIAGNOSIS — D500 Iron deficiency anemia secondary to blood loss (chronic): Secondary | ICD-10-CM | POA: Insufficient documentation

## 2022-01-03 NOTE — Progress Notes (Signed)
[facility] [phone]   [phone]  INITIAL CONSULT NOTE  No care team member to display  Hematological/Oncological History 1) Labs from PCP, Carilyn Goodpasture NP: -08/25/2021: WBC 5.5, Hgb 10.8 (L), MCV 81.4, Plt 364, Ferritin 7.6 (L), Iron 39 (L), TIBC 423, Iron saturation 9%, Transferrin 302.  -11/26/2021: WBC 6.1, Hgb 10.8 (L), MCV 82.9, Plt 300, TIBC 412, Iron 35 (L), Iron Saturation 9% (L_), Transferrin 294, Ferritin 6.4 (L), Vitamin B12 345.   2) 01/01/2022: Establish care with [facility]ology  CHIEF COMPLAINTS/PURPOSE OF CONSULTATION:  "Iron deficiency anemia"  HISTORY OF PRESENTING ILLNESS:  Crystal Wilcox 46 y.o. female with medical history significant for GERD, malaria and abnormal pap smear presents to the hematology clinic for initial evaluation of iron deficiency anemia. She is unaccompanied for this visit.   On exam today, Crystal Wilcox reports that has has history of iron deficiency anemia for approximately one year. She was taking iron pills in the past but stopped once her levels normalized. Recently she resumed iron pills approximately 3-4 weeks ago after lab work showed recurrent iron deficiency anemia and patient was symptomatic. She reports have a couple of episodes of dizziness without syncopal episodes. She is tolerating the iron pills without any toxicities. She does have some fatigue but continues to complete all her daily activities on her own. She denies any appetite changes or dietary restrictions. She denies any GI symptoms including nausea, vomiting, diarrhea or constipation. She denies easy bruising or signs of active bleeding except for her menstrual bleeding. She adds that she in pre-menopause and her periods are starting to become irregular. She adds that some months she will not have a period and other months they will be very heavy bleeding. Recently she had a cycle with very heavy menstrual bleeding which lead to her  symptoms of dizziness and fatigue. She denies fevers, chills, night sweats, shortness of breath, chest pain, cough, peripheral edema, headaches or ice chip cravings. She has no other complaints. Rest of the 10 point ROS is below.   MEDICAL HISTORY:  Past Medical History:  Diagnosis Date   Abortion history    G0P0010   GERD (gastroesophageal reflux disease)    Malaria    Hx of Malaria   Pap smear abnormality of cervix with ASCUS favoring benign    Colposcopy 10/16/09 - Normal    SURGICAL HISTORY: Past Surgical History:  Procedure Laterality Date   CESAREAN SECTION  12/10/2011   Procedure: CESAREAN SECTION;  Surgeon: Jeani Hawking, MD;  Location: [facility];  Service: Gynecology;  Laterality: N/A;   DILATION AND CURETTAGE OF UTERUS      SOCIAL HISTORY: Social History   Socioeconomic History   Marital status: Single    Spouse name: Not on file   Number of children: Not on file   Years of education: Not on file   Highest education level: Not on file  Occupational History   Not on file  Tobacco Use   Smoking status: Never   Smokeless tobacco: Never  Substance and Sexual Activity   Alcohol use: No   Drug use: No   Sexual activity: Yes  Other Topics Concern   Not on file  Social History Narrative   Not on file   Social Determinants of Health   Financial Resource Strain: Not on file  Food Insecurity: Not on file  Transportation Needs: Not on file  Physical Activity: Not on file  Stress: Not on file  Social Connections:  Not on file  Intimate Partner Violence: Not on file    FAMILY HISTORY: Family History  Problem Relation Age of Onset   Diabetes Father    Anesthesia problems Neg Hx    Other Neg Hx     ALLERGIES:  has No Known Allergies.  MEDICATIONS:  Current Outpatient Medications  Medication Sig Dispense Refill   ferrous sulfate 325 (65 FE) MG tablet Take 325 mg by mouth daily with breakfast.     valsartan (DIOVAN) 320 MG tablet Take 320 mg by mouth daily.      diclofenac sodium (VOLTAREN) 1 % GEL Apply thin layer to wrists bilaterally up to every 6 hours as needed for pain (Patient not taking: Reported on 01/01/2022) 1 Tube 3   fexofenadine-pseudoephedrine (ALLEGRA-D) 60-120 MG per tablet Take 1 tablet by mouth every 12 (twelve) hours. (Patient not taking: Reported on 01/01/2022) 30 tablet 0   Prenatal Vit-Fe Fumarate-FA (PRENATAL MULTIVITAMIN) TABS Take 1 tablet by mouth at bedtime. (Patient not taking: Reported on 01/01/2022)     No current facility-administered medications for this visit.    REVIEW OF SYSTEMS:   Constitutional: ( - ) fevers, ( - )  chills , ( - ) night sweats Eyes: ( - ) blurriness of vision, ( - ) double vision, ( - ) watery eyes Ears, nose, mouth, throat, and face: ( - ) mucositis, ( - ) sore throat Respiratory: ( - ) cough, ( - ) dyspnea, ( - ) wheezes Cardiovascular: ( - ) palpitation, ( - ) chest discomfort, ( - ) lower extremity swelling Gastrointestinal:  ( - ) nausea, ( - ) heartburn, ( - ) change in bowel habits Skin: ( - ) abnormal skin rashes Lymphatics: ( - ) new lymphadenopathy, ( - ) easy bruising Neurological: ( - ) numbness, (+) tingling, ( - ) new weaknesses Behavioral/Psych: ( - ) mood change, ( - ) new changes  All other systems were reviewed with the patient and are negative.  PHYSICAL EXAMINATION: ECOG PERFORMANCE STATUS: 1 - Symptomatic but completely ambulatory  Vitals:   01/01/22 1354  BP: 124/75  Pulse: 88  Resp: 20  Temp: 97.7 F (36.5 C)  SpO2: 100%   Filed Weights   01/01/22 1354  Weight: 243 lb 8 oz (110.5 kg)    GENERAL: well appearing female in NAD  SKIN: skin color, texture, turgor are normal, no rashes or significant lesions EYES: conjunctiva are pink and non-injected, sclera clear OROPHARYNX: no exudate, no erythema; lips, buccal mucosa, and tongue normal  NECK: supple, non-tender LYMPH:  no palpable lymphadenopathy in the cervical or supraclavicular lymph nodes.  LUNGS:  clear to auscultation and percussion with normal breathing effort HEART: regular rate & rhythm and no murmurs and no lower extremity edema ABDOMEN: soft, non-tender, non-distended, normal bowel sounds Musculoskeletal: no cyanosis of digits and no clubbing  PSYCH: alert & oriented x 3, fluent speech NEURO: no focal motor/sensory deficits  LABORATORY DATA:  I have reviewed the data as listed    Latest Ref Rng & Units 01/01/2022    3:04 PM 02/11/2012   10:29 AM 12/11/2011    5:35 AM  CBC  WBC 4.0 - 10.5 K/uL 5.4  4.8  8.8   Hemoglobin 12.0 - 15.0 g/dL 32.9  51.8  9.3   Hematocrit 36.0 - 46.0 % 33.4  39.1  27.6   Platelets 150 - 400 K/uL 266  276  186        Latest Ref Rng &  Units 01/01/2022    3:04 PM 03/07/2012    4:23 PM 02/11/2012   10:29 AM  CMP  Glucose 70 - 99 mg/dL 104  70  76   BUN 6 - 20 mg/dL 11  12  13    Creatinine 0.44 - 1.00 mg/dL 0.74  0.96  0.83   Sodium 135 - 145 mmol/L 139  140  140   Potassium 3.5 - 5.1 mmol/L 3.7  4.3  4.4   Chloride 98 - 111 mmol/L 108  104  105   CO2 22 - 32 mmol/L 26  29  27    Calcium 8.9 - 10.3 mg/dL 9.3  9.4  9.4   Total Protein 6.5 - 8.1 g/dL 6.8  6.5  7.2   Total Bilirubin 0.3 - 1.2 mg/dL 0.2  0.2  0.3   Alkaline Phos 38 - 126 U/L 53  52  55   AST 15 - 41 U/L 13  14  18    ALT 0 - 44 U/L 10  16  22      ASSESSMENT & PLAN Crystal Wilcox is a 46 y.o. female who presents to the clinic for evaluation of iron deficiency anemia. Patient is currently taking ferrous sulfate 325 mg once daily with good tolerance. Likely cause of her iron deficiency is her irregular menstrual bleeding as she is entering pre-menopause. I advised to follow up with her OB/GYN to further discuss any interventions to decrease bleeding. We will proceed with lab work today to recheck CBC, CMP and iron panel. If there is persistent iron deficiency, we recommend IV iron to help bolster levels.   #Iron deficiency anemia 2/2 menstrual bleeding: --Currently on ferrous  sulfate 325 mg once daily. Advised to continue with source of vitamin C --Provided list of iron rich foods to incorporate into diet --Labs today to check CBC, CMP, retic panel, ferritin, iron and TIBC --If above labs show persistent iron deficiency, we recommend IV iron infusions.  --RTC in 3 months with repeat labs  Orders Placed This Encounter  Procedures   CBC with Differential ([place] Only)    Standing Status:   Future    Number of Occurrences:   1    Standing Expiration Date:   01/02/2023   CMP ([facility] only)    Standing Status:   Future    Number of Occurrences:   1    Standing Expiration Date:   01/02/2023   Ferritin    Standing Status:   Future    Number of Occurrences:   1    Standing Expiration Date:   01/02/2023   Iron and Iron Binding Capacity ([facility] only)    Standing Status:   Future    Number of Occurrences:   1    Standing Expiration Date:   01/02/2023   Retic Panel    Standing Status:   Future    Number of Occurrences:   1    Standing Expiration Date:   01/02/2023    All questions were answered. The patient knows to call the clinic with any problems, questions or concerns.  I have spent a total of 60 minutes minutes of face-to-face and non-face-to-face time, preparing to see the patient, obtaining and/or reviewing separately obtained history, performing a medically appropriate examination, counseling and educating the patient, ordering tests,  documenting clinical information in the electronic health record,  and care coordination.   Dede Query, PA-C Department of Hematology/Oncology [place] at [facility] Phone: [phone]  Patient was seen with Dr. Lorenso Courier  I have read the above note and personally examined the patient. I agree with the assessment and plan as noted above.  Briefly Crystal Wilcox is a 46 year old female who presents for evaluation of iron deficiency anemia.  At this time would recommend pursuing  p.o. iron therapy.  In the event that she was unable to tolerate this or if it is ineffective we could consider administration of IV iron therapy.  Most likely etiology of her iron deficiency is a heavy menstrual cycles.  Recommend she continue to follow with her OB/GYN to keep this under good control.  We will see the patient back in 3 months time in order to assure p.o. iron therapy has been adequate.  Patient voiced understanding of the plan moving forward.   Ledell Peoples, MD Department of Hematology/Oncology [place] at [facility] Phone: [phone] Pager: [phone] Email: [email or web address]

## 2022-01-04 ENCOUNTER — Telehealth: Payer: Self-pay | Admitting: Physician Assistant

## 2022-01-04 ENCOUNTER — Telehealth: Payer: Self-pay

## 2022-01-04 ENCOUNTER — Other Ambulatory Visit: Payer: Self-pay | Admitting: Physician Assistant

## 2022-01-04 ENCOUNTER — Telehealth: Payer: Self-pay | Admitting: Pharmacy Technician

## 2022-01-04 NOTE — Telephone Encounter (Signed)
Pt advised and voiced understanding.   

## 2022-01-04 NOTE — Telephone Encounter (Signed)
-----   Message from Briant Cedar, PA-C sent at 01/04/2022  1:30 PM EDT ----- Please notify patient that she has persistent iron deficiency and we recommend IV iron. We will make arrangements of IV iron in the next week or so.

## 2022-01-04 NOTE — Telephone Encounter (Signed)
Scheduled appointment per 07/14 los. Patient aware.  

## 2022-01-04 NOTE — Telephone Encounter (Signed)
Auth Submission: pending Payer: [employer] Medication & CPT/J Code(s) submitted: monoferric [id number] Route of submission (phone, fax, portal): PHONE: [phone] FAX: [phone] Auth type: Buy/Bill Units/visits requested: X1 DOSE Reference number:  Approval from:  to

## 2022-01-08 ENCOUNTER — Other Ambulatory Visit: Payer: Self-pay | Admitting: Pharmacy Technician

## 2022-01-08 NOTE — Telephone Encounter (Signed)
Dr. Mariane Baumgarten, Monoferric has been denied due to step therapy.  Patient has not tried and or step therapy. Ref: [id number] 1. Venofer 2. Infed 3. Ferrlecit  Would you like to try venofer.

## 2022-01-09 ENCOUNTER — Encounter: Payer: Self-pay | Admitting: Physician Assistant

## 2022-02-11 ENCOUNTER — Ambulatory Visit: Payer: Managed Care, Other (non HMO)

## 2022-02-11 VITALS — BP 124/84 | HR 67 | Temp 98.3°F | Resp 18 | Ht 64.0 in | Wt 247.5 lb

## 2022-02-11 DIAGNOSIS — D500 Iron deficiency anemia secondary to blood loss (chronic): Secondary | ICD-10-CM

## 2022-02-11 MED ORDER — SODIUM CHLORIDE 0.9 % IV SOLN
200.0000 mg | Freq: Once | INTRAVENOUS | Status: AC
  Administered 2022-02-11: 200 mg via INTRAVENOUS
  Filled 2022-02-11: qty 10

## 2022-02-11 NOTE — Patient Instructions (Signed)

## 2022-02-11 NOTE — Progress Notes (Signed)
Diagnosis: Iron Deficiency Anemia  Provider:  Chilton Greathouse MD  Procedure: Infusion  IV Type: Peripheral, IV Location: L Antecubital  Venofer (Iron Sucrose), Dose: 200 mg  Infusion Start Time: 1131  Infusion Stop Time: 1149  Post Infusion IV Care: Observation period completed and Peripheral IV Discontinued  Discharge: Condition: Good, Destination: Home . AVS provided to patient.   Performed by:  Garnette Czech, RN

## 2022-02-18 ENCOUNTER — Ambulatory Visit: Payer: Managed Care, Other (non HMO) | Admitting: *Deleted

## 2022-02-18 VITALS — BP 145/99 | HR 69 | Temp 98.5°F | Resp 16 | Ht 64.0 in | Wt 245.4 lb

## 2022-02-18 DIAGNOSIS — D500 Iron deficiency anemia secondary to blood loss (chronic): Secondary | ICD-10-CM

## 2022-02-18 DIAGNOSIS — N920 Excessive and frequent menstruation with regular cycle: Secondary | ICD-10-CM

## 2022-02-18 MED ORDER — SODIUM CHLORIDE 0.9 % IV SOLN
200.0000 mg | Freq: Once | INTRAVENOUS | Status: AC
  Administered 2022-02-18: 200 mg via INTRAVENOUS
  Filled 2022-02-18: qty 10

## 2022-02-18 NOTE — Progress Notes (Signed)
Diagnosis: Iron Deficiency Anemia  Provider:  Chilton Greathouse MD  Procedure: Infusion  IV Type: Peripheral, IV Location: R Antecubital  Venofer (Iron Sucrose), Dose: 200 mg  Infusion Start Time: 1200 noon  Infusion Stop Time: 1221 pm  Post Infusion IV Care: Observation period completed and Peripheral IV Discontinued  Discharge: Condition: Good, Destination: Home . AVS provided to patient.   Performed by:  Forrest Moron, RN

## 2022-02-25 ENCOUNTER — Ambulatory Visit: Payer: Managed Care, Other (non HMO)

## 2022-02-25 ENCOUNTER — Ambulatory Visit
Admission: EM | Admit: 2022-02-25 | Discharge: 2022-02-25 | Disposition: A | Discharge status: Home / Self Care | Payer: Managed Care, Other (non HMO) | Attending: Urgent Care | Admitting: Urgent Care

## 2022-02-25 DIAGNOSIS — R051 Acute cough: Secondary | ICD-10-CM | POA: Diagnosis present

## 2022-02-25 DIAGNOSIS — J069 Acute upper respiratory infection, unspecified: Secondary | ICD-10-CM | POA: Insufficient documentation

## 2022-02-25 DIAGNOSIS — B349 Viral infection, unspecified: Secondary | ICD-10-CM | POA: Diagnosis present

## 2022-02-25 DIAGNOSIS — Z20822 Contact with and (suspected) exposure to covid-19: Secondary | ICD-10-CM | POA: Diagnosis not present

## 2022-02-25 MED ORDER — IPRATROPIUM BROMIDE 0.03 % NA SOLN: 2.0000 | Freq: Two times a day (BID) | NASAL | 0 refills | Status: DC

## 2022-02-25 MED ORDER — CETIRIZINE HCL 10 MG PO TABS: 10.0000 mg | ORAL_TABLET | Freq: Every day | ORAL | 0 refills | Status: DC

## 2022-02-25 MED ORDER — PSEUDOEPHEDRINE HCL 30 MG PO TABS: 30.0000 mg | ORAL_TABLET | Freq: Three times a day (TID) | ORAL | 0 refills | Status: DC | PRN

## 2022-02-25 MED ORDER — PROMETHAZINE-DM 6.25-15 MG/5ML PO SYRP: 2.5000 mL | ORAL_SOLUTION | Freq: Three times a day (TID) | ORAL | 0 refills | Status: DC | PRN

## 2022-02-25 NOTE — ED Triage Notes (Signed)
Pt. States that since Tuesday she has been having coughing and congestions. Yesterday she states she had a fever and is concerned for COVID.

## 2022-02-25 NOTE — Discharge Instructions (Addendum)
We will notify you of your test results as they arrive and may take between about 24 hours.  I encourage you to sign up for MyChart if you have not already done so as this can be the easiest way for us to communicate results to you online or through a phone app.  Generally, we only contact you if it is a positive test result.  In the meantime, if you develop worsening symptoms including fever, chest pain, shortness of breath despite our current treatment plan then please report to the emergency room as this may be a sign of worsening status from possible viral infection.  Otherwise, we will manage this as a viral syndrome. For sore throat or cough try using a honey-based tea. Use 3 teaspoons of honey with juice squeezed from half lemon. Place shaved pieces of ginger into 1/2-1 cup of water and warm over stove top. Then mix the ingredients and repeat every 4 hours as needed. Please take Tylenol 500mg-650mg every 6 hours for aches and pains, fevers. Hydrate very well with at least 2 liters of water. Eat light meals such as soups to replenish electrolytes and soft fruits, veggies. Start an antihistamine like Zyrtec for postnasal drainage, sinus congestion.  You can take this together with pseudoephedrine (Sudafed) at a dose of 30 mg 2-3 times a day as needed for the same kind of congestion.  Use the cough medications as needed.   

## 2022-02-25 NOTE — ED Provider Notes (Signed)
[facility]  Note:  This document was prepared using Systems analyst and may include unintentional dictation errors.  MRN: [id number] DOB: Sep 14, 1975  Subjective:   Crystal Wilcox is a 46 y.o. female presenting for 3-day history of acute onset persistent coughing, sinus congestion, malaise, fever, fatigue and body pains.  Patient had extensive exposure to the general public over the holiday weekend.  Wants to make sure she does not have COVID-19.  She would like to have COVID antiviral should she test positive.  Her GFR levels are consistent with using the full dose of Paxlovid.  No overt chest pain, shortness of breath or wheezing.  No history of respiratory disorders.  Patient is not a smoker.  No current facility-administered medications for this encounter.  Current Outpatient Medications:    ferrous sulfate 325 (65 FE) MG tablet, Take 325 mg by mouth daily with breakfast., Disp: , Rfl:    valsartan (DIOVAN) 320 MG tablet, Take 320 mg by mouth daily., Disp: , Rfl:    No Known Allergies  Past Medical History:  Diagnosis Date   Abortion history    [id number]   GERD (gastroesophageal reflux disease)    Malaria    Hx of Malaria   Pap smear abnormality of cervix with ASCUS favoring benign    Colposcopy 10/16/09 - Normal     Past Surgical History:  Procedure Laterality Date   CESAREAN SECTION  12/10/2011   Procedure: CESAREAN SECTION;  Surgeon: Cyril Mourning, MD;  Location: [facility];  Service: Gynecology;  Laterality: N/A;   DILATION AND CURETTAGE OF UTERUS      Family History  Problem Relation Age of Onset   Diabetes Father    Anesthesia problems Neg Hx    Other Neg Hx     Social History   Tobacco Use   Smoking status: Never   Smokeless tobacco: Never  Substance Use Topics   Alcohol use: No   Drug use: No    ROS   Objective:   Vitals: BP (!) 156/107   Pulse 78   Temp 98.5 F (36.9 C)   Resp 17   LMP 02/11/2022  (Approximate)   SpO2 97%   Physical Exam Constitutional:      General: She is not in acute distress.    Appearance: Normal appearance. She is well-developed and normal weight. She is not ill-appearing, toxic-appearing or diaphoretic.  HENT:     Head: Normocephalic and atraumatic.     Right Ear: Tympanic membrane, ear canal and external ear normal. No drainage or tenderness. No middle ear effusion. There is no impacted cerumen. Tympanic membrane is not erythematous.     Left Ear: Tympanic membrane, ear canal and external ear normal. No drainage or tenderness.  No middle ear effusion. There is no impacted cerumen. Tympanic membrane is not erythematous.     Nose: Congestion present. No rhinorrhea.     Mouth/Throat:     Mouth: Mucous membranes are moist. No oral lesions.     Pharynx: No pharyngeal swelling, oropharyngeal exudate, posterior oropharyngeal erythema or uvula swelling.     Tonsils: No tonsillar exudate or tonsillar abscesses.  Eyes:     General: No scleral icterus.       Right eye: No discharge.        Left eye: No discharge.     Extraocular Movements: Extraocular movements intact.     Right eye: Normal extraocular motion.     Left eye: Normal  extraocular motion.     Conjunctiva/sclera: Conjunctivae normal.  Cardiovascular:     Rate and Rhythm: Normal rate and regular rhythm.     Heart sounds: Normal heart sounds. No murmur heard.    No friction rub. No gallop.  Pulmonary:     Effort: Pulmonary effort is normal. No respiratory distress.     Breath sounds: No stridor. No wheezing, rhonchi or rales.  Chest:     Chest wall: No tenderness.  Musculoskeletal:     Cervical back: Normal range of motion and neck supple.  Lymphadenopathy:     Cervical: No cervical adenopathy.  Skin:    General: Skin is warm and dry.  Neurological:     General: No focal deficit present.     Mental Status: She is alert and oriented to person, place, and time.     Cranial Nerves: No cranial nerve  deficit.     Motor: No weakness.     Coordination: Coordination normal.     Gait: Gait normal.     Deep Tendon Reflexes: Reflexes normal.     Comments: Negative Romberg and pronator drift.  Psychiatric:        Mood and Affect: Mood normal.        Behavior: Behavior normal.        Thought Content: Thought content normal.        Judgment: Judgment normal.    Recent Results (from the past 2160 hour(s))  Retic Panel     Status: None   Collection Time: 01/01/22  3:04 PM  Result Value Ref Range   Retic Ct Pct 1.6 0.4 - 3.1 %   RBC. 3.96 3.87 - 5.11 MIL/uL   Retic Count, Absolute 64.5 19.0 - 186.0 K/uL   Immature Retic Fract 13.3 2.3 - 15.9 %   Reticulocyte Hemoglobin 32.2 >27.9 pg    Comment:        Given the high negative predictive value of a RET-He result > 32 pg iron deficiency is essentially excluded. If this patient is anemic other etiologies should be considered. Performed at [facility], [address] [place], [place] [address]   Iron and Iron Binding Capacity ([facility] only)     Status: None   Collection Time: 01/01/22  3:04 PM  Result Value Ref Range   Iron 69 28 - 170 ug/dL   TIBC 319 211 - 326 ug/dL   Saturation Ratios 20 10.4 - 31.8 %   UIBC 277 148 - 442 ug/dL    Comment: Performed at [facility], [address], [place], [place] [address]  Ferritin     Status: Abnormal   Collection Time: 01/01/22  3:04 PM  Result Value Ref Range   Ferritin 7 (L) 11 - 307 ng/mL    Comment: Performed at [facility], [address], [place], [place] [address]  CMP ([facility] Center only)     Status: Abnormal   Collection Time: 01/01/22  3:04 PM  Result Value Ref Range   Sodium 139 135 - 145 mmol/L   Potassium 3.7 3.5 - 5.1 mmol/L   Chloride 108 98 - 111 mmol/L   CO2 26 22 - 32 mmol/L   Glucose, Bld 104 (H) 70 - 99 mg/dL    Comment: Glucose reference range applies only to samples taken after fasting for  at least 8 hours.   BUN 11 6 - 20 mg/dL   Creatinine 7.07 7.07 - 1.00 mg/dL  Calcium 9.3 8.9 - 10.3 mg/dL   Total Protein 6.8 6.5 - 8.1 g/dL   Albumin 3.8 3.5 - 5.0 g/dL   AST 13 (L) 15 - 41 U/L   ALT 10 0 - 44 U/L   Alkaline Phosphatase 53 38 - 126 U/L   Total Bilirubin 0.2 (L) 0.3 - 1.2 mg/dL   GFR, Estimated >60 >60 mL/min    Comment: (NOTE) Calculated using the CKD-EPI Creatinine Equation (2021)    Anion gap 5 5 - 15    Comment: Performed at [facility], [address], [place], [place] [address]  CBC with Differential ([facility] Only)     Status: Abnormal   Collection Time: 01/01/22  3:04 PM  Result Value Ref Range   WBC Count 5.4 4.0 - 10.5 K/uL   RBC 3.96 3.87 - 5.11 MIL/uL   Hemoglobin 10.6 (L) 12.0 - 15.0 g/dL   HCT 33.4 (L) 36.0 - 46.0 %   MCV 84.3 80.0 - 100.0 fL   MCH 26.8 26.0 - 34.0 pg   MCHC 31.7 30.0 - 36.0 g/dL   RDW 14.8 11.5 - 15.5 %   Platelet Count 266 150 - 400 K/uL   nRBC 0.0 0.0 - 0.2 %   Neutrophils Relative % 53 %   Neutro Abs 2.9 1.7 - 7.7 K/uL   Lymphocytes Relative 36 %   Lymphs Abs 1.9 0.7 - 4.0 K/uL   Monocytes Relative 7 %   Monocytes Absolute 0.4 0.1 - 1.0 K/uL   Eosinophils Relative 3 %   Eosinophils Absolute 0.1 0.0 - 0.5 K/uL   Basophils Relative 1 %   Basophils Absolute 0.1 0.0 - 0.1 K/uL   Immature Granulocytes 0 %   Abs Immature Granulocytes 0.01 0.00 - 0.07 K/uL    Comment: Performed at [facility], [address], [place], [place] [address]     Assessment and Plan :   PDMP not reviewed this encounter.  1. Acute viral syndrome   2. Acute cough     Deferred imaging given clear cardiopulmonary exam, hemodynamically stable vital signs. Will manage for viral illness such as viral URI, viral syndrome, viral rhinitis, COVID-19. Recommended supportive care. Offered scripts for symptomatic relief. Testing is pending. Counseled patient on potential for adverse effects with  medications prescribed/recommended today, ER and return-to-clinic precautions discussed, patient verbalized understanding.   Patient would be a good candidate for Paxlovid should she test positive for this based off of her obesity.    Jaynee Eagles, [place] 02/25/22 2820

## 2022-02-26 ENCOUNTER — Telehealth: Payer: Self-pay

## 2022-02-26 LAB — SARS CORONAVIRUS 2 (TAT 6-24 HRS): SARS Coronavirus 2: NEGATIVE

## 2022-03-01 ENCOUNTER — Ambulatory Visit: Payer: Managed Care, Other (non HMO)

## 2022-03-01 VITALS — BP 135/90 | HR 64 | Temp 98.7°F | Resp 18 | Ht 64.0 in | Wt 244.6 lb

## 2022-03-01 DIAGNOSIS — D500 Iron deficiency anemia secondary to blood loss (chronic): Secondary | ICD-10-CM | POA: Diagnosis not present

## 2022-03-01 DIAGNOSIS — N920 Excessive and frequent menstruation with regular cycle: Secondary | ICD-10-CM

## 2022-03-01 MED ORDER — SODIUM CHLORIDE 0.9 % IV SOLN
200.0000 mg | Freq: Once | INTRAVENOUS | Status: AC
  Administered 2022-03-01: 200 mg via INTRAVENOUS
  Filled 2022-03-01: qty 10

## 2022-03-01 NOTE — Progress Notes (Signed)
Diagnosis: Iron Deficiency Anemia  Provider:  Praveen Mannam MD  Procedure: Infusion  IV Type: Peripheral, IV Location: L Antecubital  Venofer (Iron Sucrose), Dose: 200 mg  Infusion Start Time: 1131  Infusion Stop Time: 1148  Post Infusion IV Care: Peripheral IV Discontinued  Discharge: Condition: Good, Destination: Home . AVS provided to patient.   Performed by:  Jacque Garrels, RN    

## 2022-03-04 ENCOUNTER — Ambulatory Visit (INDEPENDENT_AMBULATORY_CARE_PROVIDER_SITE_OTHER): Payer: Managed Care, Other (non HMO)

## 2022-03-04 VITALS — BP 124/86 | HR 86 | Temp 98.5°F | Resp 16 | Ht 64.0 in | Wt 247.8 lb

## 2022-03-04 DIAGNOSIS — D500 Iron deficiency anemia secondary to blood loss (chronic): Secondary | ICD-10-CM | POA: Diagnosis not present

## 2022-03-04 MED ORDER — SODIUM CHLORIDE 0.9 % IV SOLN
200.0000 mg | Freq: Once | INTRAVENOUS | Status: AC
  Administered 2022-03-04: 200 mg via INTRAVENOUS
  Filled 2022-03-04: qty 10

## 2022-03-04 NOTE — Progress Notes (Signed)
Diagnosis: Iron Deficiency Anemia  Provider:  Chilton Greathouse MD  Procedure: Infusion  IV Type: Peripheral, IV Location: L Antecubital  Venofer (Iron Sucrose), Dose: 200 mg  Infusion Start Time: 1134  Infusion Stop Time: 1154  Post Infusion IV Care: Peripheral IV Discontinued  Discharge: Condition: Good, Destination: Home . AVS provided to patient.   Performed by:  Nat Math, RN

## 2022-03-11 ENCOUNTER — Ambulatory Visit: Payer: Managed Care, Other (non HMO) | Admitting: *Deleted

## 2022-03-11 VITALS — BP 113/76 | HR 82 | Temp 98.2°F | Resp 18 | Ht 64.0 in | Wt 243.8 lb

## 2022-03-11 DIAGNOSIS — N920 Excessive and frequent menstruation with regular cycle: Secondary | ICD-10-CM

## 2022-03-11 DIAGNOSIS — D500 Iron deficiency anemia secondary to blood loss (chronic): Secondary | ICD-10-CM | POA: Diagnosis not present

## 2022-03-11 MED ORDER — SODIUM CHLORIDE 0.9 % IV SOLN
200.0000 mg | Freq: Once | INTRAVENOUS | Status: AC
  Administered 2022-03-11: 200 mg via INTRAVENOUS
  Filled 2022-03-11: qty 10

## 2022-03-11 NOTE — Progress Notes (Signed)
Diagnosis: Iron Deficiency Anemia  Provider:  Marshell Garfinkel MD  Procedure: Infusion  IV Type: Peripheral, IV Location: R Forearm  Venofer (Iron Sucrose), Dose: 200 mg  Infusion Start Time: 7062 am  Infusion Stop Time: 1200 pm  Post Infusion IV Care: Observation period completed and Peripheral IV Discontinued  Discharge: Condition: Good, Destination: Home . AVS provided to patient.   Performed by:  Oren Beckmann, RN

## 2022-03-12 ENCOUNTER — Encounter: Payer: Self-pay | Admitting: Physician Assistant

## 2022-04-02 ENCOUNTER — Inpatient Hospital Stay: Payer: Managed Care, Other (non HMO) | Admitting: Physician Assistant

## 2022-04-02 ENCOUNTER — Inpatient Hospital Stay: Payer: Managed Care, Other (non HMO)

## 2022-04-05 ENCOUNTER — Other Ambulatory Visit: Payer: Self-pay | Admitting: Physician Assistant

## 2022-04-05 DIAGNOSIS — D500 Iron deficiency anemia secondary to blood loss (chronic): Secondary | ICD-10-CM

## 2022-04-06 ENCOUNTER — Inpatient Hospital Stay: Payer: Managed Care, Other (non HMO) | Admitting: Physician Assistant

## 2022-04-06 ENCOUNTER — Inpatient Hospital Stay: Payer: Managed Care, Other (non HMO) | Attending: Physician Assistant

## 2022-04-20 ENCOUNTER — Other Ambulatory Visit: Payer: Self-pay

## 2022-09-10 ENCOUNTER — Ambulatory Visit
Admission: EM | Admit: 2022-09-10 | Discharge: 2022-09-10 | Disposition: A | Discharge status: Home / Self Care | Payer: Managed Care, Other (non HMO)

## 2022-09-10 ENCOUNTER — Other Ambulatory Visit: Payer: Self-pay

## 2022-09-10 ENCOUNTER — Emergency Department (HOSPITAL_COMMUNITY)
Admission: EM | Admit: 2022-09-10 | Discharge: 2022-09-11 | Disposition: A | Discharge status: Home / Self Care | Payer: Managed Care, Other (non HMO) | Attending: Emergency Medicine | Admitting: Emergency Medicine

## 2022-09-10 DIAGNOSIS — N939 Abnormal uterine and vaginal bleeding, unspecified: Secondary | ICD-10-CM

## 2022-09-10 DIAGNOSIS — D500 Iron deficiency anemia secondary to blood loss (chronic): Secondary | ICD-10-CM

## 2022-09-10 LAB — CBC
HCT: 29.1 % — ABNORMAL LOW (ref 36.0–46.0)
Hemoglobin: 8.9 g/dL — ABNORMAL LOW (ref 12.0–15.0)
MCH: 27.9 pg (ref 26.0–34.0)
MCHC: 30.6 g/dL (ref 30.0–36.0)
MCV: 91.2 fL (ref 80.0–100.0)
Platelets: 331 10*3/uL (ref 150–400)
RBC: 3.19 MIL/uL — ABNORMAL LOW (ref 3.87–5.11)
RDW: 14.7 % (ref 11.5–15.5)
WBC: 8.1 10*3/uL (ref 4.0–10.5)
nRBC: 0 % (ref 0.0–0.2)

## 2022-09-10 LAB — I-STAT BETA HCG BLOOD, ED (MC, WL, AP ONLY): I-stat hCG, quantitative: <5 m[IU]/mL (ref <5)

## 2022-09-10 NOTE — Discharge Instructions (Signed)
Call your gynecologist to be seen for evaluation.  I heavy bleeding persist go to the Emergency department for evaltuion

## 2022-09-10 NOTE — ED Triage Notes (Signed)
Pt c/o vaginal bleeding ongoing since August 2023, worsening over the past 3 days with large clots about the size of her hand. Used 3 large pad within the last 24 hour. Has been placed on birth control 2 months ago with some improvement. Denies pain.

## 2022-09-10 NOTE — ED Provider Notes (Incomplete)
[place] EMERGENCY DEPARTMENT AT [facility] Provider Note   CSN: [id number] Arrival date & time: 09/10/22  2014     History {Add pertinent medical, surgical, social history, OB history to HPI:1} Chief Complaint  Patient presents with  . Vaginal Bleeding    Crystal Wilcox is a 47 y.o. female.   Vaginal Bleeding      Home Medications Prior to Admission medications   Medication Sig Start Date End Date Taking? Authorizing Provider  cetirizine (ZYRTEC ALLERGY) 10 MG tablet Take 1 tablet (10 mg total) by mouth daily. 02/25/22   Jaynee Eagles, PA-C  ferrous sulfate 325 (65 FE) MG tablet Take 325 mg by mouth daily with breakfast.    [provider]  HEATHER 0.35 MG tablet Take 1 tablet by mouth daily. 04/19/22   [provider]  ipratropium (ATROVENT) 0.03 % nasal spray Place 2 sprays into both nostrils 2 (two) times daily. 02/25/22   Jaynee Eagles, PA-C  promethazine-dextromethorphan (PROMETHAZINE-DM) 6.25-15 MG/5ML syrup Take 2.5 mLs by mouth 3 (three) times daily as needed for cough. 02/25/22   Jaynee Eagles, PA-C  pseudoephedrine (SUDAFED) 30 MG tablet Take 1 tablet (30 mg total) by mouth every 8 (eight) hours as needed for congestion. 02/25/22   Jaynee Eagles, PA-C  valsartan (DIOVAN) 320 MG tablet Take 320 mg by mouth daily. 12/07/21   [provider]      Allergies    Patient has no known allergies.    Review of Systems   Review of Systems  Genitourinary:  Positive for vaginal bleeding.    Physical Exam Updated Vital Signs BP 130/78 (BP Location: Left Arm)   Pulse 87   Temp 98.5 F (36.9 C) (Oral)   Resp 16   Ht 5\' 4"  (1.626 m)   Wt 108.9 kg   SpO2 100%   BMI 41.20 kg/m  Physical Exam  ED Results / Procedures / Treatments   Labs (all labs ordered are listed, but only abnormal results are displayed) Labs Reviewed  CBC - Abnormal; Notable for the following components:      Result Value   RBC 3.19 (*)    Hemoglobin 8.9 (*)     HCT 29.1 (*)    All other components within normal limits  I-STAT BETA HCG BLOOD, ED ([facility], [facility], [facility] ONLY)    EKG None  Radiology No results found.  Procedures Procedures  {Document cardiac monitor, telemetry assessment procedure when appropriate:1}  Medications Ordered in ED Medications - No data to display  ED Course/ Medical Decision Making/ A&P Clinical Course as of 09/10/22 2354  Fri Sep 10, 2022  2229 Bleeding since August (daily).  BC started in December.   [WF]    Clinical Course User Index [WF] Tedd Sias, [place]   {   Click here for ABCD2, HEART and other calculatorsREFRESH Note before signing :1}                          Medical Decision Making Amount and/or Complexity of Data Reviewed Labs: ordered.   ***  {Document critical care time when appropriate:1} {Document review of labs and clinical decision tools ie heart score, Chads2Vasc2 etc:1}  {Document your independent review of radiology images, and any outside records:1} {Document your discussion with family members, caretakers, and with consultants:1} {Document social determinants of health affecting pt's care:1} {Document your decision making why or why not admission, treatments were needed:1} Final Clinical Impression(s) / ED  Diagnoses Final diagnoses:  None    Rx / DC Orders ED Discharge Orders     None

## 2022-09-10 NOTE — ED Triage Notes (Signed)
Pt c/o vaginal bleeding for about a month that has worsened 3 days ago. The patient states she has been having large blood clots. The pt denies pain but has been having dizziness, and SHOB. Patient does not display physical signs/ symptoms of respiratory distress.

## 2022-09-10 NOTE — ED Provider Notes (Signed)
[place] EMERGENCY DEPARTMENT AT [facility] Provider Note   CSN: [id number] Arrival date & time: 09/10/22  2014     History  Chief Complaint  Patient presents with   Vaginal Bleeding    Crystal Wilcox is a 47 y.o. female.   Vaginal Bleeding Patient is a 47 year old female with past medical history significant for fibroids presenting emergency room today with complaints of ongoing vaginal bleeding.  Seems that she has had bleeding since August 2023 however worsening over the past 2 or 3 days.  She states that she occasionally has large blood clots.  She states she has been feeling more fatigued recently she is continue to take her iron supplement and Heather OCP which she was prescribed by her [facility].  She denies any abdominal pain nausea or vomiting.  She denies any syncope or near syncope but does state that she feels generally fatigued.       Home Medications Prior to Admission medications   Medication Sig Start Date End Date Taking? Authorizing Provider  tranexamic acid (LYSTEDA) 650 MG TABS tablet Take 2 tablets (1,300 mg total) by mouth 3 (three) times daily. 09/11/22  Yes Rollan Roger, Kathleene Hazel, PA  cetirizine (ZYRTEC ALLERGY) 10 MG tablet Take 1 tablet (10 mg total) by mouth daily. 02/25/22   Jaynee Eagles, PA-C  ferrous sulfate 325 (65 FE) MG tablet Take 325 mg by mouth daily with breakfast.    [provider]  ipratropium (ATROVENT) 0.03 % nasal spray Place 2 sprays into both nostrils 2 (two) times daily. 02/25/22   Jaynee Eagles, PA-C  promethazine-dextromethorphan (PROMETHAZINE-DM) 6.25-15 MG/5ML syrup Take 2.5 mLs by mouth 3 (three) times daily as needed for cough. 02/25/22   Jaynee Eagles, PA-C  pseudoephedrine (SUDAFED) 30 MG tablet Take 1 tablet (30 mg total) by mouth every 8 (eight) hours as needed for congestion. 02/25/22   Jaynee Eagles, PA-C  valsartan (DIOVAN) 320 MG tablet Take 320 mg by mouth daily. 12/07/21   [provider]       Allergies    Patient has no known allergies.    Review of Systems   Review of Systems  Genitourinary:  Positive for vaginal bleeding.    Physical Exam Updated Vital Signs BP 130/78 (BP Location: Left Arm)   Pulse 87   Temp 98.5 F (36.9 C) (Oral)   Resp 16   Ht 5\' 4"  (1.626 m)   Wt 108.9 kg   SpO2 100%   BMI 41.20 kg/m  Physical Exam Vitals and nursing note reviewed.  Constitutional:      General: She is not in acute distress. HENT:     Head: Normocephalic and atraumatic.     Nose: Nose normal.  Eyes:     General: No scleral icterus. Cardiovascular:     Rate and Rhythm: Normal rate and regular rhythm.     Pulses: Normal pulses.     Heart sounds: Normal heart sounds.  Pulmonary:     Effort: Pulmonary effort is normal. No respiratory distress.     Breath sounds: No wheezing.  Abdominal:     Palpations: Abdomen is soft.     Tenderness: There is no abdominal tenderness.  Musculoskeletal:     Cervical back: Normal range of motion.     Right lower leg: No edema.     Left lower leg: No edema.  Skin:    General: Skin is warm and dry.     Capillary Refill: Capillary refill takes less than  2 seconds.  Neurological:     Mental Status: She is alert. Mental status is at baseline.  Psychiatric:        Mood and Affect: Mood normal.        Behavior: Behavior normal.     ED Results / Procedures / Treatments   Labs (all labs ordered are listed, but only abnormal results are displayed) Labs Reviewed  CBC - Abnormal; Notable for the following components:      Result Value   RBC 3.19 (*)    Hemoglobin 8.9 (*)    HCT 29.1 (*)    All other components within normal limits  I-STAT BETA HCG BLOOD, ED ([facility], [facility], [facility] ONLY)    EKG None  Radiology No results found.  Procedures Procedures    Medications Ordered in ED Medications  tranexamic acid (LYSTEDA) tablet 1,300 mg (has no administration in time range)  ibuprofen (ADVIL) tablet 600 mg (has no administration in  time range)    ED Course/ Medical Decision Making/ A&P Clinical Course as of 09/11/22 0047  Fri Sep 10, 2022  2229 Bleeding since August (daily).  BC started in December.   [WF]  Sat Sep 11, 2022  0031 Stop heather  1500 lystida TXA TID for 5 days (only while heavy) W DVT risk  Progesterone 10mg  a day for 10 days.    [WF]  0034 Dr. Charlesetta Garibaldi [WF]    Clinical Course User Index [WF] Tedd Sias, [place]                             Medical Decision Making Amount and/or Complexity of Data Reviewed Labs: ordered.   Patient is a 47 year old female with past medical history significant for fibroids presenting emergency room today with complaints of ongoing vaginal bleeding.  Seems that she has had bleeding since August 2023 however worsening over the past 2 or 3 days.  She states that she occasionally has large blood clots.  She states she has been feeling more fatigued recently she is continue to take her iron supplement and Heather OCP which she was prescribed by her [facility].  She denies any abdominal pain nausea or vomiting.  She denies any syncope or near syncope but does state that she feels generally fatigued.  Physical exam unremarkable, patient is pleasant, well-appearing, normal vital signs.  Lungs are clear and she is in no acute distress.  Her CBC shows anemia of 8.9 somewhat worsened from 8 months ago where he was 10.6.  I suspect this is from vaginal bleeding.  Acid hCG negative for pregnancy.  Vital signs normal  Discussed with Dr. Charlesetta Garibaldi of on-call OB/GYN for [facility] practice.  He recommended she stop taking Heather and take either lystida or progesterone.  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  This prescription was sent to the pharmacy for her and she was given 1 dose here.  Given Motrin recommendations and strict return precautions.  She will follow-up with OB/GYN asap   Final Clinical Impression(s) / ED  Diagnoses Final diagnoses:  Vaginal bleeding  Blood loss anemia    Rx / DC Orders ED Discharge Orders          Ordered    tranexamic acid (LYSTEDA) 650 MG TABS tablet  3 times daily        09/11/22 0043  Pati Gallo [place], [place] 09/11/22 0048    Fatima Blank, MD 09/11/22 [phone]

## 2022-09-11 MED ORDER — TRANEXAMIC ACID 650 MG PO TABS: 1300.0000 mg | ORAL_TABLET | Freq: Three times a day (TID) | ORAL | 0 refills | Status: DC

## 2022-09-11 MED ORDER — TRANEXAMIC ACID 650 MG PO TABS
1300.0000 mg | ORAL_TABLET | ORAL | Status: AC
  Administered 2022-09-11: 1300 mg via ORAL
  Filled 2022-09-11: qty 2

## 2022-09-11 MED ORDER — IBUPROFEN 200 MG PO TABS
600.0000 mg | ORAL_TABLET | Freq: Once | ORAL | Status: AC
  Administered 2022-09-11: 600 mg via ORAL
  Filled 2022-09-11: qty 3

## 2022-09-11 NOTE — Discharge Instructions (Signed)
Take ibuprofen 600 mg every 6 hours make sure you are drinking plenty of water    Eat plenty of red meat and spinach and continue taking your iron supplement.  Follow-up with your OB/GYN as soon as you are able to.  Stop taking the [place] birth control and take the tranexamic acid as prescribed for 5 days.

## 2022-09-16 NOTE — ED Provider Notes (Signed)
[facility]    CSN: [id number] Arrival date & time: 09/10/22  1922      History   Chief Complaint Chief Complaint  Patient presents with   Vaginal Bleeding    HPI Crystal Wilcox is a 47 y.o. female.   Patient complains of heavy vaginal bleeding.  Patient has been seen by her OB/GYN and placed on birth control.  Patient reports she has a history of anemia and is on iron.  The history is provided by the patient. No language interpreter was used.  Vaginal Bleeding Quality:  Heavier than menses Severity:  Moderate Onset quality:  Gradual   Past Medical History:  Diagnosis Date   Abortion history    [id number]   GERD (gastroesophageal reflux disease)    Malaria    Hx of Malaria   Pap smear abnormality of cervix with ASCUS favoring benign    Colposcopy 10/16/09 - Normal    Patient Active Problem List   Diagnosis Date Noted   Iron deficiency anemia due to chronic blood loss 01/03/2022   De Quervain's tenosynovitis, bilateral 03/07/2012   Abdominal pain 02/25/2011   Overweight(278.02) 09/03/2010   Dyspepsia 09/03/2010   GERD 02/18/2010   MALARIA, HX OF 12/29/2006    Past Surgical History:  Procedure Laterality Date   CESAREAN SECTION  12/10/2011   Procedure: CESAREAN SECTION;  Surgeon: Cyril Mourning, MD;  Location: [facility];  Service: Gynecology;  Laterality: N/A;   DILATION AND CURETTAGE OF UTERUS      OB History     Gravida  2   Para  1   Term  1   Preterm  0   AB  1   Living  1      SAB  0   IAB  1   Ectopic      Multiple      Live Births  1            Home Medications    Prior to Admission medications   Medication Sig Start Date End Date Taking? Authorizing Provider  cetirizine (ZYRTEC ALLERGY) 10 MG tablet Take 1 tablet (10 mg total) by mouth daily. 02/25/22   Jaynee Eagles, PA-C  ferrous sulfate 325 (65 FE) MG tablet Take 325 mg by mouth daily with breakfast.    [provider]  ipratropium (ATROVENT) 0.03 %  nasal spray Place 2 sprays into both nostrils 2 (two) times daily. 02/25/22   Jaynee Eagles, PA-C  promethazine-dextromethorphan (PROMETHAZINE-DM) 6.25-15 MG/5ML syrup Take 2.5 mLs by mouth 3 (three) times daily as needed for cough. 02/25/22   Jaynee Eagles, PA-C  pseudoephedrine (SUDAFED) 30 MG tablet Take 1 tablet (30 mg total) by mouth every 8 (eight) hours as needed for congestion. 02/25/22   Jaynee Eagles, PA-C  tranexamic acid (LYSTEDA) 650 MG TABS tablet Take 2 tablets (1,300 mg total) by mouth 3 (three) times daily. 09/11/22   Tedd Sias, PA  valsartan (DIOVAN) 320 MG tablet Take 320 mg by mouth daily. 12/07/21   [provider]    Family History Family History  Problem Relation Age of Onset   Diabetes Father    Anesthesia problems Neg Hx    Other Neg Hx     Social History Social History   Tobacco Use   Smoking status: Never   Smokeless tobacco: Never  Substance Use Topics   Alcohol use: No   Drug use: No     Allergies   Patient has no known  allergies.   Review of Systems Review of Systems  Genitourinary:  Positive for vaginal bleeding.  All other systems reviewed and are negative.    Physical Exam Triage Vital Signs ED Triage Vitals  Enc Vitals Group     BP 09/10/22 1929 120/85     Pulse Rate 09/10/22 1929 91     Resp 09/10/22 1929 18     Temp 09/10/22 1929 98.2 F (36.8 C)     Temp Source 09/10/22 1929 Oral     SpO2 09/10/22 1929 98 %     Weight --      Height --      Head Circumference --      Peak Flow --      Pain Score 09/10/22 1928 0     Pain Loc --      Pain Edu? --      Excl. in [place]? --    No data found.  Updated Vital Signs BP 120/85 (BP Location: Right Arm)   Pulse 91   Temp 98.2 F (36.8 C) (Oral)   Resp 18   SpO2 98%   Visual Acuity Right Eye Distance:   Left Eye Distance:   Bilateral Distance:    Right Eye Near:   Left Eye Near:    Bilateral Near:     Physical Exam Vitals and nursing note reviewed.  Constitutional:       Appearance: She is well-developed.  HENT:     Head: Normocephalic.  Pulmonary:     Effort: Pulmonary effort is normal.  Abdominal:     General: There is no distension.  Musculoskeletal:     Cervical back: Normal range of motion.  Neurological:     Mental Status: She is alert and oriented to person, place, and time.      UC Treatments / Results  Labs (all labs ordered are listed, but only abnormal results are displayed) Labs Reviewed - No data to display  EKG   Radiology No results found.  Procedures Procedures (including critical care time)  Medications Ordered in UC Medications - No data to display  Initial Impression / Assessment and Plan / UC Course  I have reviewed the triage vital signs and the nursing notes.  Pertinent labs & imaging results that were available during my care of the patient were reviewed by me and considered in my medical decision making (see chart for details).     MDM: Patient is not having any shortness of breath vital signs are normal. Final Clinical Impressions(s) / UC Diagnoses   Final diagnoses:  Abnormal vaginal bleeding     Discharge Instructions      Call your gynecologist to be seen for evaluation.  I heavy bleeding persist go to the Emergency department for evaltuion    ED Prescriptions   None    PDMP not reviewed this encounter. An After Visit Summary was printed and given to the patient.       Fransico Meadow, [place] 09/16/22 [phone]

## 2022-09-22 ENCOUNTER — Emergency Department (HOSPITAL_COMMUNITY)
Admission: EM | Admit: 2022-09-22 | Discharge: 2022-09-23 | Disposition: A | Discharge status: Home / Self Care | Payer: Managed Care, Other (non HMO) | Attending: Emergency Medicine | Admitting: Emergency Medicine

## 2022-09-22 ENCOUNTER — Emergency Department (HOSPITAL_COMMUNITY): Payer: Managed Care, Other (non HMO)

## 2022-09-22 ENCOUNTER — Other Ambulatory Visit: Payer: Self-pay

## 2022-09-22 ENCOUNTER — Encounter (HOSPITAL_COMMUNITY): Payer: Self-pay

## 2022-09-22 DIAGNOSIS — R1032 Left lower quadrant pain: Secondary | ICD-10-CM | POA: Diagnosis not present

## 2022-09-22 DIAGNOSIS — D649 Anemia, unspecified: Secondary | ICD-10-CM | POA: Diagnosis not present

## 2022-09-22 DIAGNOSIS — R1031 Right lower quadrant pain: Secondary | ICD-10-CM | POA: Insufficient documentation

## 2022-09-22 DIAGNOSIS — B3731 Acute candidiasis of vulva and vagina: Secondary | ICD-10-CM | POA: Insufficient documentation

## 2022-09-22 DIAGNOSIS — R1030 Lower abdominal pain, unspecified: Secondary | ICD-10-CM | POA: Diagnosis present

## 2022-09-22 DIAGNOSIS — R102 Pelvic and perineal pain: Secondary | ICD-10-CM

## 2022-09-22 DIAGNOSIS — R109 Unspecified abdominal pain: Secondary | ICD-10-CM

## 2022-09-22 LAB — URINALYSIS, ROUTINE W REFLEX MICROSCOPIC
Bacteria, UA: NONE SEEN
Bilirubin Urine: NEGATIVE
Glucose, UA: NEGATIVE mg/dL
Ketones, ur: NEGATIVE mg/dL
Nitrite: NEGATIVE
Protein, ur: NEGATIVE mg/dL
Specific Gravity, Urine: 1.027 (ref 1.005–1.030)
pH: 5 (ref 5.0–8.0)

## 2022-09-22 LAB — COMPREHENSIVE METABOLIC PANEL
ALT: 12 U/L (ref 0–44)
AST: 16 U/L (ref 15–41)
Albumin: 3.5 g/dL (ref 3.5–5.0)
Alkaline Phosphatase: 41 U/L (ref 38–126)
Anion gap: 9 (ref 5–15)
BUN: 9 mg/dL (ref 6–20)
CO2: 22 mmol/L (ref 22–32)
Calcium: 8.4 mg/dL — ABNORMAL LOW (ref 8.9–10.3)
Chloride: 105 mmol/L (ref 98–111)
Creatinine, Ser: 0.72 mg/dL (ref 0.44–1.00)
GFR, Estimated: >60 mL/min (ref >60)
Glucose, Bld: 99 mg/dL (ref 70–99)
Potassium: 3.3 mmol/L — ABNORMAL LOW (ref 3.5–5.1)
Sodium: 136 mmol/L (ref 135–145)
Total Bilirubin: 0.3 mg/dL (ref 0.3–1.2)
Total Protein: 6.7 g/dL (ref 6.5–8.1)

## 2022-09-22 LAB — CBC WITH DIFFERENTIAL/PLATELET
Abs Immature Granulocytes: 0.01 10*3/uL (ref 0.00–0.07)
Basophils Absolute: 0.1 10*3/uL (ref 0.0–0.1)
Basophils Relative: 1 %
Eosinophils Absolute: 0.2 10*3/uL (ref 0.0–0.5)
Eosinophils Relative: 3 %
HCT: 33.8 % — ABNORMAL LOW (ref 36.0–46.0)
Hemoglobin: 10.2 g/dL — ABNORMAL LOW (ref 12.0–15.0)
Immature Granulocytes: 0 %
Lymphocytes Relative: 28 %
Lymphs Abs: 1.8 10*3/uL (ref 0.7–4.0)
MCH: 28.2 pg (ref 26.0–34.0)
MCHC: 30.2 g/dL (ref 30.0–36.0)
MCV: 93.4 fL (ref 80.0–100.0)
Monocytes Absolute: 0.3 10*3/uL (ref 0.1–1.0)
Monocytes Relative: 4 %
Neutro Abs: 4.1 10*3/uL (ref 1.7–7.7)
Neutrophils Relative %: 64 %
Platelets: 417 10*3/uL — ABNORMAL HIGH (ref 150–400)
RBC: 3.62 MIL/uL — ABNORMAL LOW (ref 3.87–5.11)
RDW: 14.9 % (ref 11.5–15.5)
WBC: 6.4 10*3/uL (ref 4.0–10.5)
nRBC: 0 % (ref 0.0–0.2)

## 2022-09-22 LAB — WET PREP, GENITAL
Clue Cells Wet Prep HPF POC: NONE SEEN
Sperm: NONE SEEN
Trich, Wet Prep: NONE SEEN
WBC, Wet Prep HPF POC: <10 (ref <10)

## 2022-09-22 LAB — PREGNANCY, URINE: Preg Test, Ur: NEGATIVE

## 2022-09-22 MED ORDER — OXYCODONE-ACETAMINOPHEN 5-325 MG PO TABS: 1.0000 | ORAL_TABLET | Freq: Four times a day (QID) | ORAL | 0 refills | Status: DC | PRN

## 2022-09-22 MED ORDER — HYDROMORPHONE HCL 1 MG/ML IJ SOLN
1.0000 mg | Freq: Once | INTRAMUSCULAR | Status: AC
  Administered 2022-09-22: 1 mg via INTRAVENOUS
  Filled 2022-09-22: qty 1

## 2022-09-22 MED ORDER — FENTANYL CITRATE PF 50 MCG/ML IJ SOSY
50.0000 ug | PREFILLED_SYRINGE | Freq: Once | INTRAMUSCULAR | Status: AC
  Administered 2022-09-22: 50 ug via INTRAVENOUS
  Filled 2022-09-22: qty 1

## 2022-09-22 MED ORDER — KETOROLAC TROMETHAMINE 30 MG/ML IJ SOLN
30.0000 mg | Freq: Once | INTRAMUSCULAR | Status: AC
  Administered 2022-09-22: 30 mg via INTRAVENOUS
  Filled 2022-09-22: qty 1

## 2022-09-22 MED ORDER — FLUCONAZOLE 200 MG PO TABS
200.0000 mg | ORAL_TABLET | Freq: Once | ORAL | Status: AC
  Administered 2022-09-23: 200 mg via ORAL
  Filled 2022-09-22: qty 1

## 2022-09-22 MED ORDER — IOHEXOL 300 MG/ML  SOLN
100.0000 mL | Freq: Once | INTRAMUSCULAR | Status: AC | PRN
  Administered 2022-09-22: 100 mL via INTRAVENOUS

## 2022-09-22 MED ORDER — ONDANSETRON HCL 4 MG/2ML IJ SOLN
4.0000 mg | Freq: Once | INTRAMUSCULAR | Status: AC
  Administered 2022-09-22: 4 mg via INTRAVENOUS
  Filled 2022-09-22: qty 2

## 2022-09-22 MED ORDER — IBUPROFEN 600 MG PO TABS: 600.0000 mg | ORAL_TABLET | Freq: Four times a day (QID) | ORAL | 0 refills | Status: AC | PRN

## 2022-09-22 NOTE — ED Provider Notes (Signed)
[place] EMERGENCY DEPARTMENT AT [facility] Provider Note   CSN: [id number] Arrival date & time: 09/22/22  1739     History  Chief Complaint  Patient presents with   Abdominal Pain   Back Pain    Crystal Wilcox is a 47 y.o. female presenting to ED with abdominal pain.  Patient reports onset of symptoms gradually about 2 days ago.  The pain is diffuse lower abdominal tenderness that seems to wrap around towards her back.  It is worse with movement.  She denies nausea, vomiting, constipation or diarrhea.  She reports she has regular bowel movements.  She reports that she was having some irregular menses and some vaginal bleeding on and off for the past couple months and is not certain if she is on her menses now.  She does note some yellow discharge is unclear if this is acute or chronic.  She denies any new sexual partners, has been a monogamous relationship with the same partner.  She reports her abdominal surgeries include a C-section and hernia repair in the past.  HPI     Home Medications Prior to Admission medications   Medication Sig Start Date End Date Taking? Authorizing Provider  ibuprofen (ADVIL) 600 MG tablet Take 1 tablet (600 mg total) by mouth every 6 (six) hours as needed. 09/22/22 10/22/22 Yes Adoni Greenough, Carola Rhine, MD  oxyCODONE-acetaminophen (PERCOCET/ROXICET) 5-325 MG tablet Take 1 tablet by mouth every 6 (six) hours as needed for up to 15 doses for severe pain. 09/22/22  Yes Wyvonnia Dusky, MD  cetirizine (ZYRTEC ALLERGY) 10 MG tablet Take 1 tablet (10 mg total) by mouth daily. 02/25/22   Jaynee Eagles, PA-C  ferrous sulfate 325 (65 FE) MG tablet Take 325 mg by mouth daily with breakfast.    [provider]  ipratropium (ATROVENT) 0.03 % nasal spray Place 2 sprays into both nostrils 2 (two) times daily. 02/25/22   Jaynee Eagles, PA-C  promethazine-dextromethorphan (PROMETHAZINE-DM) 6.25-15 MG/5ML syrup Take 2.5 mLs by mouth 3 (three) times daily as needed  for cough. 02/25/22   Jaynee Eagles, PA-C  pseudoephedrine (SUDAFED) 30 MG tablet Take 1 tablet (30 mg total) by mouth every 8 (eight) hours as needed for congestion. 02/25/22   Jaynee Eagles, PA-C  tranexamic acid (LYSTEDA) 650 MG TABS tablet Take 2 tablets (1,300 mg total) by mouth 3 (three) times daily. 09/11/22   Tedd Sias, PA  valsartan (DIOVAN) 320 MG tablet Take 320 mg by mouth daily. 12/07/21   [provider]      Allergies    Patient has no known allergies.    Review of Systems   Review of Systems  Physical Exam Updated Vital Signs BP (!) 115/99   Pulse 71   Temp 98.3 F (36.8 C) (Oral)   Resp 18   Ht 5\' 4"  (1.626 m)   Wt 108.9 kg   SpO2 100%   BMI 41.20 kg/m  Physical Exam Constitutional:      General: She is not in acute distress.    Appearance: She is obese.  HENT:     Head: Normocephalic and atraumatic.  Eyes:     Conjunctiva/sclera: Conjunctivae normal.     Pupils: Pupils are equal, round, and reactive to light.  Cardiovascular:     Rate and Rhythm: Normal rate and regular rhythm.  Pulmonary:     Effort: Pulmonary effort is normal. No respiratory distress.  Abdominal:     General: There is no distension.  Tenderness: There is abdominal tenderness in the right lower quadrant, suprapubic area and left lower quadrant.  Genitourinary:    Comments: GU exam was performed with the patient's nurse present as chaperone.  There were no lesions noted within the external vaginal canal.  The patient had some scant yellowish discharge noted within the vaginal canal.  Cervical os appeared normal, on digital exam she has no cervical motion tenderness but there is a palpable tubular fibrous type structure noted directly behind the cervical os Skin:    General: Skin is warm and dry.  Neurological:     General: No focal deficit present.     Mental Status: She is alert. Mental status is at baseline.  Psychiatric:        Mood and Affect: Mood normal.         Behavior: Behavior normal.     ED Results / Procedures / Treatments   Labs (all labs ordered are listed, but only abnormal results are displayed) Labs Reviewed  WET PREP, GENITAL - Abnormal; Notable for the following components:      Result Value   Yeast Wet Prep HPF POC PRESENT (*)    All other components within normal limits  CBC WITH DIFFERENTIAL/PLATELET - Abnormal; Notable for the following components:   RBC 3.62 (*)    Hemoglobin 10.2 (*)    HCT 33.8 (*)    Platelets 417 (*)    All other components within normal limits  COMPREHENSIVE METABOLIC PANEL - Abnormal; Notable for the following components:   Potassium 3.3 (*)    Calcium 8.4 (*)    All other components within normal limits  URINALYSIS, ROUTINE W REFLEX MICROSCOPIC - Abnormal; Notable for the following components:   Hgb urine dipstick SMALL (*)    Leukocytes,Ua SMALL (*)    All other components within normal limits  PREGNANCY, URINE  LIPASE, BLOOD  GC/CHLAMYDIA PROBE AMP ([place]) NOT AT [facility]    EKG None  Radiology CT Abdomen Pelvis W Contrast  Result Date: 09/22/2022 CLINICAL DATA:  Right lower quadrant abdominal pain EXAM: CT ABDOMEN AND PELVIS WITH CONTRAST TECHNIQUE: Multidetector CT imaging of the abdomen and pelvis was performed using the standard protocol following bolus administration of intravenous contrast. RADIATION DOSE REDUCTION: This exam was performed according to the departmental dose-optimization program which includes automated exposure control, adjustment of the mA and/or kV according to patient size and/or use of iterative reconstruction technique. CONTRAST:  115mL OMNIPAQUE IOHEXOL 300 MG/ML  SOLN COMPARISON:  None FINDINGS: Lower chest: No acute abnormality. Hepatobiliary: Subcentimeter probable cyst at the hepatic dome. No enhancing intrahepatic mass. No intra or extrahepatic biliary ductal dilation. Gallbladder unremarkable. Pancreas: Unremarkable Spleen: Unremarkable Adrenals/Urinary  Tract: Adrenal glands are unremarkable. Kidneys are normal, without renal calculi, focal lesion, or hydronephrosis. Bladder is unremarkable. Stomach/Bowel: Stomach is within normal limits. Appendix appears normal. No evidence of bowel wall thickening, distention, or inflammatory changes. Vascular/Lymphatic: No significant vascular findings are present. No enlarged abdominal or pelvic lymph nodes. Reproductive: Multiple enhancing submucosal and subserosal masses are seen within the uterus in keeping with multiple probable uterine fibroids. Additionally, there is a rounded mass seen within the endocervical canal measuring roughly 3.7 x 4.2 x 3.7 cm, best seen on axial image # 71/2 and sagittal image # 79/6. This mass appears to demonstrate a stalk which arises from the lower uterine segment best seen on axial image # 70, sagittal image # 79 suggesting that this represents a partially expulsed, pedunculated endometrial polyp or  uterine fibroid. No adnexal masses. Other: No abdominal wall hernia Musculoskeletal: No acute bone abnormality. No lytic or blastic bone lesion. IMPRESSION: 1. Multiple enhancing submucosal and subserosal masses within the uterus in keeping with multiple uterine fibroids. Additionally, there is a rounded mass seen within the endocervical canal measuring roughly 3.7 x 4.2 x 3.7 cm. This appears to demonstrate a stalk which arises from the lower uterine segment suggesting that this represents a partially expulsed, pedunculated endometrial polyp or uterine fibroid. Dedicated pelvic sonography and gynecologic consultation may be helpful for further management. Electronically Signed   By: Fidela Salisbury M.D.   On: 09/22/2022 21:26    Procedures Procedures    Medications Ordered in ED Medications  fluconazole (DIFLUCAN) tablet 200 mg (has no administration in time range)  HYDROmorphone (DILAUDID) injection 1 mg (1 mg Intravenous Given 09/22/22 1914)  ondansetron (ZOFRAN) injection 4 mg (4 mg  Intravenous Given 09/22/22 1914)  ketorolac (TORADOL) 30 MG/ML injection 30 mg (30 mg Intravenous Given 09/22/22 1914)  iohexol (OMNIPAQUE) 300 MG/ML solution 100 mL (100 mLs Intravenous Contrast Given 09/22/22 2101)  fentaNYL (SUBLIMAZE) injection 50 mcg (50 mcg Intravenous Given 09/22/22 2216)    ED Course/ Medical Decision Making/ A&P                             Medical Decision Making Amount and/or Complexity of Data Reviewed Labs: ordered.  Risk Prescription drug management.   This patient presents to the ED with concern for lower abdominal pain. This involves an extensive number of treatment options, and is a complaint that carries with it a high risk of complications and morbidity.  The differential diagnosis includes appendicitis versus colitis versus diverticulitis versus ureteral colic versus cystitis versus pelvic pathology versus other  I ordered and personally interpreted labs.  The pertinent results include: Chronic mild anemia with hemoglobin at 10.2, close to baseline.  CMP is largely unremarkable.  Wet prep shows yeast cells, negative for trichomonas and clue cells.  UA without clear evidence of infection, only small leukocytes, negative nitrites, no bacteria noted.  GU exam is consistent with a tuberous potential growth near the endocervical canal.  I clarified with the patient that she does not have any foreign devices inserted inside the vagina.  I suspect that this needs another examination by her OB/GYN and the patient may need ongoing discussion about her pelvic pain.  I ordered imaging studies including CT abdomen pelvis I independently visualized and interpreted imaging which showed multiple uterine fibroids including pedunculated potential fibroid within the endocervical canal, no other acute emergent findings I agree with the radiologist interpretation  The patient was maintained on a cardiac monitor.  I personally viewed and interpreted the cardiac monitored which  showed an underlying rhythm of: Sinus rhythm  I ordered medication including IV dilaudid, fentanyl, Toradol for pain I have reviewed the patients home medicines and have made adjustments as needed.  Diflucan for yeast infection  Test Considered: I will lower suspicion for acute ovarian torsion or TOA do not see an indication for emergent pelvic ultrasound at this time   After the interventions noted above, I reevaluated the patient and found that they have: improved  Social Determinants of Health: patient is preferring to watch and wait approach for GC chlamydia testing, and overall is low risk for gonorrhea or chlamydia with her monogamous relationship.  If she test positive she understands that she will need further antibiotics.  Dispostion:  After consideration of the diagnostic results and the patients response to treatment, I feel that the patent would benefit from outpatient follow-up with her gynecologist office.  I suspect most likely her pain is related from pelvic pathology including her uterine fibroids.  She has an on growth that is palpable within the endocervical canal, but it is not tender, does not appear to be the cause of her abdominal pain.  She does not demonstrate evidence of PID on my exam.  I do not see indication for hospitalization or antibiotics at this time.  Low suspicion for acute appendicitis or intra-abdominal surgical or infectious emergency.  Her pain appears well enough controlled that she is wanting to try home pain control medications.  She has an appointment with OB/GYN next week.  Return precautions were discussed         Final Clinical Impression(s) / ED Diagnoses Final diagnoses:  Pelvic pain in female  Vulvovaginal candidiasis  Abdominal pain, unspecified abdominal location    Rx / DC Orders ED Discharge Orders          Ordered    oxyCODONE-acetaminophen (PERCOCET/ROXICET) 5-325 MG tablet  Every 6 hours PRN        09/22/22 2314    ibuprofen  (ADVIL) 600 MG tablet  Every 6 hours PRN        09/22/22 2314              Wyvonnia Dusky, MD 09/22/22 2320

## 2022-09-22 NOTE — ED Triage Notes (Signed)
Patient reports abdominal pain and back pain that started on Sunday. Reports taking ibuprofen with minimal relief. Pt reports relief with sitting, but is more painful with movement. Denies any nausea, vomiting, bowel or bladder complaints.

## 2022-09-22 NOTE — ED Notes (Signed)
Pt ambulatory to bathroom

## 2022-09-22 NOTE — ED Notes (Signed)
Pelvic exam complete by Dr. Langston Masker with this RN

## 2022-09-22 NOTE — ED Notes (Signed)
Pt ambulatory to bathroom for urine sample.

## 2022-09-22 NOTE — Discharge Instructions (Addendum)
Please come back to your OB/GYN office tomorrow to request an urgent follow-up visit for your visit in the ER.  Your CT scan showed that you have multiple uterine fibroids, as well as a larger fibroid that is sitting in your cervix.  This may be a cause of your abdominal pain.  Fibroids tend to swell up and cause bleeding and clots during the menstrual periods.  I recommend that you take ibuprofen as prescribed, and also Percocet as needed for severe breakthrough pain.  If this is still unable to control your pain, or you begin feeling lightheaded, loss of consciousness, persistent vomiting, or have other emergency medical concerns, please return to the ER.

## 2022-09-22 NOTE — ED Provider Triage Note (Signed)
Emergency Medicine Provider Triage Evaluation Note  Crystal Wilcox , a 47 y.o. female  was evaluated in triage.  Pt complains of right lower quadrant abdominal pain with some mild right-sided low back tenderness.  Patient states that pain began on Sunday.  She endorses worsening pain with movement.  She denies nausea, vomiting, bowel changes, dysuria, vaginal discharge, anorexia  Review of Systems  Positive: As above Negative: As above  Physical Exam  BP (!) 137/92 (BP Location: Left Arm)   Pulse 87   Temp 98.8 F (37.1 C) (Oral)   Resp 18   Ht 5\' 4"  (1.626 m)   Wt 108.9 kg   SpO2 100%   BMI 41.20 kg/m  Gen:   Awake, no distress   Resp:  Normal effort  MSK:   Moves extremities without difficulty  Other:    Medical Decision Making  Medically screening exam initiated at 6:04 PM.  Appropriate orders placed.  Crystal Wilcox was informed that the remainder of the evaluation will be completed by another provider, this initial triage assessment does not replace that evaluation, and the importance of remaining in the ED until their evaluation is complete.     Crystal Peng, PA-C 09/22/22 1805

## 2022-09-23 LAB — GC/CHLAMYDIA PROBE AMP (~~LOC~~) NOT AT ARMC
Chlamydia: NEGATIVE
Comment: NEGATIVE
Comment: NORMAL
Neisseria Gonorrhea: NEGATIVE

## 2023-01-20 ENCOUNTER — Ambulatory Visit
Admission: EM | Admit: 2023-01-20 | Discharge: 2023-01-20 | Disposition: A | Discharge status: Home / Self Care | Payer: Managed Care, Other (non HMO) | Attending: Emergency Medicine | Admitting: Emergency Medicine

## 2023-01-20 DIAGNOSIS — R531 Weakness: Secondary | ICD-10-CM

## 2023-01-20 DIAGNOSIS — Z862 Personal history of diseases of the blood and blood-forming organs and certain disorders involving the immune mechanism: Secondary | ICD-10-CM

## 2023-01-20 NOTE — Discharge Instructions (Signed)
At this time, your EKG is not concerning for an acute cardiac event such as a heart attack or irregular rhythm.  To further evaluate possible causes of weakness, we checked a complete blood cell count and metabolic panel which look for anemia as well as electrolyte imbalances.  The results of these test will take several days to return and we will notify you of the results once we receive them.  I provided you with a note to return to work in 3 days which is the maximum number of days I am allowed to provide.  In the next 3 days, and certainly anytime going forward, if you begin to feel shortness of breath, chest pain weakness, nausea, altered mental status, worsening blurred vision, please go to the emergency room instead of coming here to urgent care.  Please reach out to your primary care provider regarding your elevated blood pressure to discuss options for additional blood pressure medications.  Thank you for visiting [facility] today.

## 2023-01-20 NOTE — ED Provider Notes (Signed)
[facility]    CSN: [id number] Arrival date & time: 01/20/23  1327    HISTORY   Chief Complaint  Patient presents with   Hypertension   HPI Crystal Wilcox is a pleasant, 47 y.o. female who presents to urgent care today. Patient complains of headache, blurry vision, weakness, fatigue and generally feeling unwell for the past 2 days.  Patient states she felt dizzy last night and had to sit up because the dizziness got worse when she laid down.  Patient states her blood pressure was 132/92 last night.  Patient reports a history of anemia.  Patient states anemia has caused her to feel dizzy in the past.  Patient states she cannot be out of work until she gets a note from healthcare provider stating that she can be out of work.  Patient states she continues to take valsartan 320 mg daily for her blood pressure.  Blood pressure arrival today is 155/95.  The history is provided by the patient.   Past Medical History:  Diagnosis Date   Abortion history    [id number]   GERD (gastroesophageal reflux disease)    Malaria    Hx of Malaria   Pap smear abnormality of cervix with ASCUS favoring benign    Colposcopy 10/16/09 - Normal   Patient Active Problem List   Diagnosis Date Noted   Iron deficiency anemia due to chronic blood loss 01/03/2022   De Quervain's tenosynovitis, bilateral 03/07/2012   Abdominal pain 02/25/2011   Overweight(278.02) 09/03/2010   Dyspepsia 09/03/2010   GERD 02/18/2010   MALARIA, HX OF 12/29/2006   Past Surgical History:  Procedure Laterality Date   CESAREAN SECTION  12/10/2011   Procedure: CESAREAN SECTION;  Surgeon: Jeani Hawking, MD;  Location: [facility];  Service: Gynecology;  Laterality: N/A;   DILATION AND CURETTAGE OF UTERUS     OB History     Gravida  2   Para  1   Term  1   Preterm  0   AB  1   Living  1      SAB  0   IAB  1   Ectopic      Multiple      Live Births  1          Home Medications    Prior to  Admission medications   Medication Sig Start Date End Date Taking? Authorizing Provider  cetirizine (ZYRTEC ALLERGY) 10 MG tablet Take 1 tablet (10 mg total) by mouth daily. 02/25/22   Wallis Bamberg, PA-C  ferrous sulfate 325 (65 FE) MG tablet Take 325 mg by mouth daily with breakfast.    [provider]  ipratropium (ATROVENT) 0.03 % nasal spray Place 2 sprays into both nostrils 2 (two) times daily. 02/25/22   Wallis Bamberg, PA-C  oxyCODONE-acetaminophen (PERCOCET/ROXICET) 5-325 MG tablet Take 1 tablet by mouth every 6 (six) hours as needed for up to 15 doses for severe pain. 09/22/22   Terald Sleeper, MD  promethazine-dextromethorphan (PROMETHAZINE-DM) 6.25-15 MG/5ML syrup Take 2.5 mLs by mouth 3 (three) times daily as needed for cough. 02/25/22   Wallis Bamberg, PA-C  pseudoephedrine (SUDAFED) 30 MG tablet Take 1 tablet (30 mg total) by mouth every 8 (eight) hours as needed for congestion. 02/25/22   Wallis Bamberg, PA-C  tranexamic acid (LYSTEDA) 650 MG TABS tablet Take 2 tablets (1,300 mg total) by mouth 3 (three) times daily. 09/11/22   Gailen Shelter, PA  valsartan (DIOVAN) 320 MG  tablet Take 320 mg by mouth daily. 12/07/21   [provider]    Family History Family History  Problem Relation Age of Onset   Diabetes Father    Anesthesia problems Neg Hx    Other Neg Hx    Social History Social History   Tobacco Use   Smoking status: Never   Smokeless tobacco: Never  Substance Use Topics   Alcohol use: No   Drug use: No   Allergies   Patient has no known allergies.  Review of Systems Review of Systems Pertinent findings revealed after performing a 14 point review of systems has been noted in the history of present illness.  Physical Exam Vital Signs BP (!) 155/95 (BP Location: Right Arm)   Pulse 84   Temp 99.6 F (37.6 C) (Oral)   Resp 16   LMP 01/07/2023 (Exact Date)   SpO2 98%   No data found.  Physical Exam Vitals and nursing note reviewed.  Constitutional:       General: She is not in acute distress.    Appearance: Normal appearance. She is obese. She is not ill-appearing.  HENT:     Head: Normocephalic and atraumatic.  Eyes:     Pupils: Pupils are equal, round, and reactive to light.  Cardiovascular:     Rate and Rhythm: Normal rate and regular rhythm.  Pulmonary:     Effort: Pulmonary effort is normal.     Breath sounds: Normal breath sounds.  Musculoskeletal:        General: Normal range of motion.     Cervical back: Normal range of motion and neck supple.  Skin:    General: Skin is warm and dry.  Neurological:     General: No focal deficit present.     Mental Status: She is alert and oriented to person, place, and time. Mental status is at baseline.  Psychiatric:        Mood and Affect: Mood normal.        Behavior: Behavior normal.        Thought Content: Thought content normal.        Judgment: Judgment normal.     Visual Acuity Right Eye Distance:   Left Eye Distance:   Bilateral Distance:    Right Eye Near:   Left Eye Near:    Bilateral Near:     UC Couse / Diagnostics / Procedures:     Radiology No results found.  Procedures Procedures (including critical care time) EKG  Pending results:  Labs Reviewed  COMPREHENSIVE METABOLIC PANEL  CBC WITH DIFFERENTIAL/PLATELET    Medications Ordered in UC: Medications - No data to display  UC Diagnoses / Final Clinical Impressions(s)   I have reviewed the triage vital signs and the nursing notes.  Pertinent labs & imaging results that were available during my care of the patient were reviewed by me and considered in my medical decision making (see chart for details).    Final diagnoses:  History of anemia  Weakness   Patient advised EKG findings not concerning for acute cardiac event.  Have obtained a CBC and a metabolic panel to evaluate for anemia and electrolyte imbalance which may be the cause of her symptoms at this time.  Will treat based on results.   Patient strongly encouraged to follow-up with her PCP regarding uncontrolled hypertension.  Note provided to return to work at her request.  Please see discharge instructions below for details of plan of care as provided to  patient. ED Prescriptions   None    PDMP not reviewed this encounter.  Pending results:  Labs Reviewed  COMPREHENSIVE METABOLIC PANEL  CBC WITH DIFFERENTIAL/PLATELET    Discharge Instructions:   Discharge Instructions      At this time, your EKG is not concerning for an acute cardiac event such as a heart attack or irregular rhythm.  To further evaluate possible causes of weakness, we checked a complete blood cell count and metabolic panel which look for anemia as well as electrolyte imbalances.  The results of these test will take several days to return and we will notify you of the results once we receive them.  I provided you with a note to return to work in 3 days which is the maximum number of days I am allowed to provide.  In the next 3 days, and certainly anytime going forward, if you begin to feel shortness of breath, chest pain weakness, nausea, altered mental status, worsening blurred vision, please go to the emergency room instead of coming here to urgent care.  Please reach out to your primary care provider regarding your elevated blood pressure to discuss options for additional blood pressure medications.  Thank you for visiting  Urgent Care today.      Disposition Upon Discharge:  Condition: stable for discharge home  Patient presented with an acute illness with associated systemic symptoms and significant discomfort requiring urgent management. In my opinion, this is a condition that a prudent lay person (someone who possesses an average knowledge of health and medicine) may potentially expect to result in complications if not addressed urgently such as respiratory distress, impairment of bodily function or dysfunction of bodily  organs.   Routine symptom specific, illness specific and/or disease specific instructions were discussed with the patient and/or caregiver at length.   As such, the patient has been evaluated and assessed, work-up was performed and treatment was provided in alignment with urgent care protocols and evidence based medicine.  Patient/parent/caregiver has been advised that the patient may require follow up for further testing and treatment if the symptoms continue in spite of treatment, as clinically indicated and appropriate.  Patient/parent/caregiver has been advised to return to the [facility] or PCP if no better; to PCP or the Emergency Department if new signs and symptoms develop, or if the current signs or symptoms continue to change or worsen for further workup, evaluation and treatment as clinically indicated and appropriate  The patient will follow up with their current PCP if and as advised. If the patient does not currently have a PCP we will assist them in obtaining one.   The patient may need specialty follow up if the symptoms continue, in spite of conservative treatment and management, for further workup, evaluation, consultation and treatment as clinically indicated and appropriate.  Patient/parent/caregiver verbalized understanding and agreement of plan as discussed.  All questions were addressed during visit.  Please see discharge instructions below for further details of plan.  This office note has been dictated using Teaching laboratory technician.  Unfortunately, this method of dictation can sometimes lead to typographical or grammatical errors.  I apologize for your inconvenience in advance if this occurs.  Please do not hesitate to reach out to me if clarification is needed.      Theadora Rama Scales, PA-C 01/21/23 1056

## 2023-01-20 NOTE — ED Triage Notes (Signed)
Pt reports, headache, blurry vision, "my body do not feel well" x 2 days;  last night she felt dizzy and her blood pressure was 138/92. Reports she is anemia, and can be a cause of her symptoms too. Pt states she can not be out of work until she gets a note from a healthcare facility.

## 2023-01-21 ENCOUNTER — Telehealth: Payer: Self-pay | Admitting: Emergency Medicine

## 2023-01-21 DIAGNOSIS — E876 Hypokalemia: Secondary | ICD-10-CM

## 2023-01-21 MED ORDER — POTASSIUM CHLORIDE CRYS ER 20 MEQ PO TBCR: 20.0000 meq | EXTENDED_RELEASE_TABLET | Freq: Every day | ORAL | 0 refills | Status: DC

## 2023-01-21 NOTE — Telephone Encounter (Signed)
Pt has K level of 3.3, Klor-con sent to pharmacy. Pt notified.

## 2023-05-30 ENCOUNTER — Other Ambulatory Visit: Payer: Self-pay

## 2023-05-30 ENCOUNTER — Emergency Department (HOSPITAL_COMMUNITY): Payer: Managed Care, Other (non HMO)

## 2023-05-30 ENCOUNTER — Emergency Department (HOSPITAL_COMMUNITY)
Admission: EM | Admit: 2023-05-30 | Discharge: 2023-05-30 | Disposition: A | Discharge status: Home / Self Care | Payer: Managed Care, Other (non HMO) | Attending: Emergency Medicine | Admitting: Emergency Medicine

## 2023-05-30 DIAGNOSIS — S0990XA Unspecified injury of head, initial encounter: Secondary | ICD-10-CM | POA: Diagnosis present

## 2023-05-30 DIAGNOSIS — Y92410 Unspecified street and highway as the place of occurrence of the external cause: Secondary | ICD-10-CM | POA: Insufficient documentation

## 2023-05-30 DIAGNOSIS — S060X0A Concussion without loss of consciousness, initial encounter: Secondary | ICD-10-CM | POA: Insufficient documentation

## 2023-05-30 DIAGNOSIS — M25512 Pain in left shoulder: Secondary | ICD-10-CM | POA: Insufficient documentation

## 2023-05-30 MED ORDER — CYCLOBENZAPRINE HCL 10 MG PO TABS: 10.0000 mg | ORAL_TABLET | Freq: Two times a day (BID) | ORAL | 0 refills | Status: DC | PRN

## 2023-05-30 MED ORDER — IBUPROFEN 800 MG PO TABS: 800.0000 mg | ORAL_TABLET | Freq: Three times a day (TID) | ORAL | 0 refills | Status: DC

## 2023-05-30 MED ORDER — ACETAMINOPHEN 325 MG PO TABS
650.0000 mg | ORAL_TABLET | Freq: Once | ORAL | Status: AC
  Administered 2023-05-30: 650 mg via ORAL
  Filled 2023-05-30: qty 2
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
## 2023-08-21 ENCOUNTER — Other Ambulatory Visit: Payer: Self-pay

## 2023-08-21 ENCOUNTER — Emergency Department (HOSPITAL_COMMUNITY)

## 2023-08-21 ENCOUNTER — Emergency Department (HOSPITAL_COMMUNITY)
Admission: EM | Admit: 2023-08-21 | Discharge: 2023-08-21 | Disposition: A | Discharge status: Home / Self Care | Attending: Emergency Medicine | Admitting: Emergency Medicine

## 2023-08-21 DIAGNOSIS — S39011A Strain of muscle, fascia and tendon of abdomen, initial encounter: Secondary | ICD-10-CM | POA: Diagnosis not present

## 2023-08-21 DIAGNOSIS — X58XXXA Exposure to other specified factors, initial encounter: Secondary | ICD-10-CM | POA: Diagnosis not present

## 2023-08-21 DIAGNOSIS — J40 Bronchitis, not specified as acute or chronic: Secondary | ICD-10-CM | POA: Insufficient documentation

## 2023-08-21 DIAGNOSIS — S3991XA Unspecified injury of abdomen, initial encounter: Secondary | ICD-10-CM | POA: Diagnosis present

## 2023-08-21 LAB — CBC
HCT: 35.6 % — ABNORMAL LOW (ref 36.0–46.0)
Hemoglobin: 11.3 g/dL — ABNORMAL LOW (ref 12.0–15.0)
MCH: 27.4 pg (ref 26.0–34.0)
MCHC: 31.7 g/dL (ref 30.0–36.0)
MCV: 86.4 fL (ref 80.0–100.0)
Platelets: 382 10*3/uL (ref 150–400)
RBC: 4.12 MIL/uL (ref 3.87–5.11)
RDW: 13.4 % (ref 11.5–15.5)
WBC: 7.2 10*3/uL (ref 4.0–10.5)
nRBC: 0 % (ref 0.0–0.2)

## 2023-08-21 LAB — COMPREHENSIVE METABOLIC PANEL
ALT: 15 U/L (ref 0–44)
AST: 20 U/L (ref 15–41)
Albumin: 3.1 g/dL — ABNORMAL LOW (ref 3.5–5.0)
Alkaline Phosphatase: 49 U/L (ref 38–126)
Anion gap: 10 (ref 5–15)
BUN: 14 mg/dL (ref 6–20)
CO2: 25 mmol/L (ref 22–32)
Calcium: 9.4 mg/dL (ref 8.9–10.3)
Chloride: 104 mmol/L (ref 98–111)
Creatinine, Ser: 0.78 mg/dL (ref 0.44–1.00)
GFR, Estimated: >60 mL/min (ref >60)
Glucose, Bld: 117 mg/dL — ABNORMAL HIGH (ref 70–99)
Potassium: 3.5 mmol/L (ref 3.5–5.1)
Sodium: 139 mmol/L (ref 135–145)
Total Bilirubin: 0.4 mg/dL (ref 0.0–1.2)
Total Protein: 6.9 g/dL (ref 6.5–8.1)

## 2023-08-21 LAB — URINALYSIS, ROUTINE W REFLEX MICROSCOPIC
Bacteria, UA: NONE SEEN
Bilirubin Urine: NEGATIVE
Glucose, UA: NEGATIVE mg/dL
Ketones, ur: NEGATIVE mg/dL
Leukocytes,Ua: NEGATIVE
Nitrite: NEGATIVE
Protein, ur: NEGATIVE mg/dL
Specific Gravity, Urine: 1.023 (ref 1.005–1.030)
pH: 5 (ref 5.0–8.0)

## 2023-08-21 LAB — LIPASE, BLOOD: Lipase: 37 U/L (ref 11–51)

## 2023-08-21 LAB — RESP PANEL BY RT-PCR (RSV, FLU A&B, COVID)  RVPGX2
Influenza A by PCR: NEGATIVE
Influenza B by PCR: NEGATIVE
Resp Syncytial Virus by PCR: NEGATIVE
SARS Coronavirus 2 by RT PCR: NEGATIVE

## 2023-08-21 LAB — HCG, SERUM, QUALITATIVE: Preg, Serum: NEGATIVE

## 2023-08-21 MED ORDER — BENZONATATE 100 MG PO CAPS: 100.0000 mg | ORAL_CAPSULE | Freq: Three times a day (TID) | ORAL | 0 refills | Status: DC

## 2023-08-21 MED ORDER — IPRATROPIUM-ALBUTEROL 0.5-2.5 (3) MG/3ML IN SOLN
3.0000 mL | Freq: Once | RESPIRATORY_TRACT | Status: AC
  Administered 2023-08-21: 3 mL via RESPIRATORY_TRACT
  Filled 2023-08-21: qty 3

## 2023-08-21 MED ORDER — IBUPROFEN 800 MG PO TABS
800.0000 mg | ORAL_TABLET | Freq: Once | ORAL | Status: AC
  Administered 2023-08-21: 800 mg via ORAL
  Filled 2023-08-21: qty 1

## 2023-08-21 MED ORDER — PREDNISONE 20 MG PO TABS
60.0000 mg | ORAL_TABLET | Freq: Once | ORAL | Status: AC
  Administered 2023-08-21: 60 mg via ORAL
  Filled 2023-08-21: qty 3

## 2023-08-21 MED ORDER — PREDNISONE 10 MG (21) PO TBPK: ORAL_TABLET | Freq: Every day | ORAL | 0 refills | Status: DC

## 2023-08-21 MED ORDER — BENZONATATE 100 MG PO CAPS
100.0000 mg | ORAL_CAPSULE | Freq: Once | ORAL | Status: AC
  Administered 2023-08-21: 100 mg via ORAL
  Filled 2023-08-21: qty 1

## 2023-08-21 MED ORDER — ALBUTEROL SULFATE HFA 108 (90 BASE) MCG/ACT IN AERS
1.0000 | INHALATION_SPRAY | Freq: Four times a day (QID) | RESPIRATORY_TRACT | Status: DC | PRN
  Administered 2023-08-21: 2 via RESPIRATORY_TRACT
  Filled 2023-08-21: qty 6.7

## 2023-08-21 MED ORDER — LIDOCAINE 5 % EX PTCH
2.0000 | MEDICATED_PATCH | CUTANEOUS | Status: DC
  Administered 2023-08-21: 2 via TRANSDERMAL
  Filled 2023-08-21: qty 2

## 2023-08-21 NOTE — Discharge Instructions (Signed)
 Please follow-up with your primary care doctor, I believe that you have something called bronchitis, which is inflammation, up your lungs.  I have prescribed you some steroids, and albuterol inhaler.  Have also prescribed you some medication, to help with your cough.  I think your flank pain is likely secondary to a muscle strain, and that you should use Tylenol and ibuprofen as well as warm compresses to help with the pain.  Return to the ER secondary to severe chest pain, shortness of breath, or abdominal pain

## 2023-08-21 NOTE — ED Provider Notes (Signed)
 [place] EMERGENCY DEPARTMENT AT [facility] Provider Note   CSN: [id number] Arrival date & time: 08/21/23  1242     History  Chief Complaint  Patient presents with   Cough   Nasal Congestion   Flank Pain    Crystal Wilcox is a 48 y.o. female, history of malaria, who presents to the ED secondary to a cough, this been going on for the last 3 weeks, as well as right flank pain, this been going on for the last 2 days.  She states that for the last 3 weeks, she has had a cough, that is wet, and harsh.  She has tried to get over it, but it is just not going away.  She reports that she is not have any wheezing, fevers, or chills.  Notes that this she has is just barking cough that is persistent.  Also states about 2 days ago, she started and right flank pain, that is worse when she turns her trunk, and is not associated with any urinary complaints.  She denies any kind of abdominal pain, or nausea, vomiting, or diarrhea.  No vaginal discharge or discomfort.  Home Medications Prior to Admission medications   Medication Sig Start Date End Date Taking? Authorizing Provider  benzonatate (TESSALON) 100 MG capsule Take 1 capsule (100 mg total) by mouth every 8 (eight) hours. 08/21/23  Yes Kalley Nicholl L, PA  predniSONE (STERAPRED UNI-PAK 21 TAB) 10 MG (21) TBPK tablet Take by mouth daily. Take 6 tabs by mouth daily  for 1 days, then 5 tabs for 2 days, then 4 tabs for 2 days, then 3 tabs for 2 days, 2 tabs for 2 days, then 1 tab by mouth daily for 2 days 08/21/23  Yes Vala Raffo L, PA  cyclobenzaprine (FLEXERIL) 10 MG tablet Take 1 tablet (10 mg total) by mouth 2 (two) times daily as needed for muscle spasms. 05/30/23   Barrett, Horald Chestnut, PA-C  ferrous sulfate 325 (65 FE) MG tablet Take 325 mg by mouth daily with breakfast.    [provider]  ibuprofen (ADVIL) 800 MG tablet Take 1 tablet (800 mg total) by mouth 3 (three) times daily. 05/30/23   Barrett, Horald Chestnut, PA-C  potassium  chloride SA (KLOR-CON M) 20 MEQ tablet Take 1 tablet (20 mEq total) by mouth daily for 3 days. 01/21/23 01/24/23  Theadora Rama Scales, PA-C  valsartan (DIOVAN) 320 MG tablet Take 320 mg by mouth daily. 12/07/21   [provider]      Allergies    Patient has no known allergies.    Review of Systems   Review of Systems  Constitutional:  Negative for fever.  Respiratory:  Positive for cough.   Genitourinary:  Positive for flank pain.    Physical Exam Updated Vital Signs BP 126/76   Pulse 99   Temp 98.5 F (36.9 C)   Resp 18   LMP 07/05/2023 (Approximate)   SpO2 96%  Physical Exam Vitals and nursing note reviewed.  Constitutional:      General: She is not in acute distress.    Appearance: She is well-developed.  HENT:     Head: Normocephalic and atraumatic.     Right Ear: Tympanic membrane normal.     Left Ear: Tympanic membrane normal.     Mouth/Throat:     Mouth: Mucous membranes are moist.  Eyes:     Conjunctiva/sclera: Conjunctivae normal.  Cardiovascular:     Rate and Rhythm: Normal rate  and regular rhythm.     Heart sounds: No murmur heard. Pulmonary:     Effort: Pulmonary effort is normal. No respiratory distress.     Breath sounds: Normal breath sounds.  Abdominal:     Palpations: Abdomen is soft.     Tenderness: There is no abdominal tenderness.  Musculoskeletal:        General: No swelling.     Cervical back: Neck supple.     Comments: Tenderness to palpation of the right flank, around the abdominal muscle, worse with truncal rotation.  No guarding, or CVA tenderness.  No wounds or rash present  Skin:    General: Skin is warm and dry.     Capillary Refill: Capillary refill takes less than 2 seconds.  Neurological:     Mental Status: She is alert.  Psychiatric:        Mood and Affect: Mood normal.     ED Results / Procedures / Treatments   Labs (all labs ordered are listed, but only abnormal results are displayed) Labs Reviewed   COMPREHENSIVE METABOLIC PANEL - Abnormal; Notable for the following components:      Result Value   Glucose, Bld 117 (*)    Albumin 3.1 (*)    All other components within normal limits  CBC - Abnormal; Notable for the following components:   Hemoglobin 11.3 (*)    HCT 35.6 (*)    All other components within normal limits  URINALYSIS, ROUTINE W REFLEX MICROSCOPIC - Abnormal; Notable for the following components:   APPearance HAZY (*)    Hgb urine dipstick [name] (*)    All other components within normal limits  RESP PANEL BY RT-PCR (RSV, FLU A&B, COVID)  RVPGX2  LIPASE, BLOOD  HCG, SERUM, QUALITATIVE    EKG None  Radiology DG Chest 2 View Result Date: 08/21/2023 CLINICAL DATA:  Cough for 3 weeks. EXAM: CHEST - 2 VIEW COMPARISON:  Chest radiographs 12/10/2005. FINDINGS: The heart size and mediastinal contours are normal. The lungs are clear. There is no pleural effusion or pneumothorax. No acute osseous findings are identified. Mild thoracic spine degenerative changes. IMPRESSION: No evidence of acute cardiopulmonary process. Electronically Signed   By: Carey Bullocks M.D.   On: 08/21/2023 14:34    Procedures Procedures    Medications Ordered in ED Medications  lidocaine (LIDODERM) 5 % 2 patch (2 patches Transdermal Patch Applied 08/21/23 1513)  albuterol (VENTOLIN HFA) 108 (90 Base) MCG/ACT inhaler 1-2 puff (has no administration in time range)  ibuprofen (ADVIL) tablet 800 mg (800 mg Oral Given 08/21/23 1511)  benzonatate (TESSALON) capsule 100 mg (100 mg Oral Given 08/21/23 1511)  predniSONE (DELTASONE) tablet 60 mg (60 mg Oral Given 08/21/23 1511)  ipratropium-albuterol (DUONEB) 0.5-2.5 (3) MG/3ML nebulizer solution 3 mL (3 mLs Nebulization Given 08/21/23 1511)    ED Course/ Medical Decision Making/ A&P                                 Medical Decision Making Patient is a 48 year old female, here for a barking cough this been going on for the last 3 weeks, no shortness of breath  however.  She sounds pretty good on exam, however given her persistent cough, will give her DuoNeb, help open her up a little bit, as well as do Tessalon Perles, and a prednisone dose.  We also obtain a chest x-ray COVID/flu.  She also complains of some right flank  pain, she is tender to palpation, along the right flank, around the abdominal muscle, I am suspicious of likely will likely abdominal strain, I will however we will obtain a urinalysis, blood work for further evaluation.  She has no abdominal pain, or urinary symptoms or vaginal symptoms  Amount and/or Complexity of Data Reviewed Labs: ordered.    Details: Blood work unremarkable Radiology: ordered.    Details: Chest x-ray clear Discussion of management or test interpretation with external provider(s): Discussed with patient, she is feeling better after DuoNeb, and medications.  Her blood work and her chest x-ray are reassuring there is no evidence of pneumonia.  Believe this is likely secondary to sequela of the URI, probable development of bronchitis, we will start her on steroids, as well as albuterol inhaler, to help with her symptoms.  Tessalon Perles sent for persistent cough, and advised, the flank pain is likely representing, and abdominal wall strain, and advised on Tylenol and ibuprofen as well as lidocaine patches for help for pain.  Risk Prescription drug management.  Final Clinical Impression(s) / ED Diagnoses Final diagnoses:  Bronchitis  Strain of abdominal muscle, initial encounter    Rx / DC Orders ED Discharge Orders          Ordered    predniSONE (STERAPRED UNI-PAK 21 TAB) 10 MG (21) TBPK tablet  Daily        08/21/23 1554    benzonatate (TESSALON) 100 MG capsule  Every 8 hours        08/21/23 1554              Jaythan Hinely Elbert Ewings, [place] 08/21/23 1603    Gwyneth Sprout, MD 08/21/23 2017

## 2023-10-04 NOTE — Progress Notes (Signed)
 CC: Vaginal discharge  Subjective:   HPI: Crystal Wilcox is a 48 y.o. y/o G3P1011 who presents today for vaginal discharge.  No LMP recorded (lmp unknown)..    Vaginal discharge and odor x 3 weeks Requests STI testing   Past medical, surgical, social and family histories reviewed and updated.   ______________________________________________________________________  Past Surgical History:  Procedure Laterality Date  . CESAREAN SECTION    . HERNIA REPAIR    . HYSTEROSCOPY N/A 10/11/2022   HYSTEROSCOPY WITH DILATATION AND CURETTAGE, MYOMECTOMY performed by Therisa Lanna Ades, MD at [facility] L[facility] OR    No Known Allergies  Past Medical History:  Diagnosis Date  . Anemia   . Hypertension   . Other    Fibroids    OB History  Gravida Para Term Preterm AB Living  3 1 1  0 1 1  SAB IAB Ectopic Molar Multiple Live Births   1    1    # Outcome Date GA Lbr Len/2nd Weight Sex Type Anes PTL Lv  3 Term 12/10/11 [redacted]w[redacted]d   F CS-Unspec EPI  LIV     Birth Comments: within normal limits  2 IAB           1 Gravida               Family History  Problem Relation Name Age of Onset  . Hypertension Father    . Diabetes type II Father      Social History   Socioeconomic History  . Marital status: Single    Spouse name: Not on file  . Number of children: Not on file  . Years of education: Not on file  . Highest education level: Not on file  Occupational History  . Not on file  Tobacco Use  . Smoking status: Never  . Smokeless tobacco: Never  Vaping Use  . Vaping status: Never Used  Substance and Sexual Activity  . Alcohol use: No  . Drug use: Never    Comment: Drug use: Denies  . Sexual activity: Yes    Partners: Male    Birth control/protection: None  Other Topics Concern  . Not on file  Social History Narrative  . Not on file   Social Drivers of Health   Food Insecurity: Not on file  Transportation Needs: Not on file  Safety: Not on file  Living Situation:  Not on file     Current Outpatient Medications:  .  ascorbic acid (VITAMIN C) 250 mg tablet, Take 250 mg by mouth daily., Disp: , Rfl:  .  ferrous sulfate 325 mg (65 mg iron ) tablet, TAKE 1 TABLET BY MOUTH EVERY DAY WITH BREAKFAST, Disp: 30 tablet, Rfl: 2 .  tranexamic acid  (LYSTEDA ) 650 mg tab, Take 2 tablets (1300 mg) 3 times a day for up to 3 days during heaviest days of period., Disp: 30 tablet, Rfl: 3 .  valsartan-hydroCHLOROthiazide (DIOVAN HCT) 320-12.5 mg per tablet, Take 1 tablet by mouth daily., Disp: , Rfl:    Review Of Systems negative except as above.  Objective:   PHYSICAL EXAM: BP 140/85   Ht 1.626 m (5' 4)   Wt 116 kg (256 lb)   LMP  (LMP Unknown)   BMI 43.94 kg/m  Constitutional: Well-developed, well-nourished female in no acute distress Neurological: Alert and oriented to person, place, and time Psychiatric: Mood and affect appropriate Skin: Warm and dry, no rashes or lesions Respiratory: Normal work of breathing. Cardiovascular: Warm and well perfused Gastrointestinal: Soft,  nontender, nondistended. No masses or hernias appreciated. Genitourinary:         External Genitalia: Normal female genitalia w/o masses, lesions, rashes    Urethral Meatus: Normal caliber and position    Urethra: Midline, no masses    Bladder: Well-suspended, NT    Vagina: mucosa pink and moist without lesions or ulcers     Cervix: No lesions, normal size and consistency; no cervical motion tenderness. Small amount white vaginal discharge    Uterus: Normal size and contour; smooth, mobile, NT Adnexa/Parametria: No masses; no parametrial nodularity; no tenderness Perineum/Anus: No lesions, no hemorids   Chaperone present during exam.    Assessment/Plan:  Crystal Wilcox is a 48 y.o. female G82P1011 who presents for :   1. Screening for STDs (sexually transmitted diseases) (Primary) - Chlamydia / Gonococcus (GC), NAAT; Future - Hepatitis B Surface Antigen (HBSAG) Screen,  Qualitative With Confirmation; Future - Hepatitis C Virus (HCV) Antibody Screen With Confirmation; Future - HIV Screen with Reflex to Confirmation; Future - Rapid Plasma Reagin (RPR), Qualitative Test with Reflex to Titer and Confirmation; Future - POC Wet Prep - POC KOH Test, Vaginal  2. Vaginal discharge - wet prep negative, however, given odor, discharge, and appearance of discharge on exam, will empirically treat with Flagyl  - Other STI testing pending - Patient to notify me if not improved with treatment      Lavanda Rumalda Glaze, MD

## 2024-05-02 ENCOUNTER — Emergency Department (HOSPITAL_COMMUNITY)

## 2024-05-02 ENCOUNTER — Other Ambulatory Visit: Payer: Self-pay

## 2024-05-02 ENCOUNTER — Encounter (HOSPITAL_COMMUNITY): Payer: Self-pay

## 2024-05-02 ENCOUNTER — Emergency Department (HOSPITAL_COMMUNITY)
Admission: EM | Admit: 2024-05-02 | Discharge: 2024-05-02 | Disposition: A | Discharge status: Home / Self Care | Attending: Emergency Medicine | Admitting: Emergency Medicine

## 2024-05-02 DIAGNOSIS — R051 Acute cough: Secondary | ICD-10-CM | POA: Diagnosis not present

## 2024-05-02 DIAGNOSIS — I10 Essential (primary) hypertension: Secondary | ICD-10-CM | POA: Insufficient documentation

## 2024-05-02 DIAGNOSIS — Z79899 Other long term (current) drug therapy: Secondary | ICD-10-CM | POA: Diagnosis not present

## 2024-05-02 DIAGNOSIS — R059 Cough, unspecified: Secondary | ICD-10-CM | POA: Diagnosis present

## 2024-05-02 LAB — RESP PANEL BY RT-PCR (RSV, FLU A&B, COVID)  RVPGX2
Influenza A by PCR: NEGATIVE
Influenza B by PCR: NEGATIVE
Resp Syncytial Virus by PCR: NEGATIVE
SARS Coronavirus 2 by RT PCR: NEGATIVE

## 2024-05-02 MED ORDER — VALSARTAN 320 MG PO TABS: 320.0000 mg | ORAL_TABLET | Freq: Every day | ORAL | 0 refills | Status: DC

## 2024-05-02 MED ORDER — BENZONATATE 100 MG PO CAPS: 100.0000 mg | ORAL_CAPSULE | Freq: Three times a day (TID) | ORAL | 0 refills | Status: DC

## 2024-05-02 MED ORDER — BENZONATATE 100 MG PO CAPS
100.0000 mg | ORAL_CAPSULE | Freq: Once | ORAL | Status: AC
  Administered 2024-05-02: 100 mg via ORAL
  Filled 2024-05-02: qty 1

## 2024-05-02 NOTE — ED Provider Notes (Signed)
 [place] EMERGENCY DEPARTMENT AT [facility] Provider Note   CSN: [id number] Arrival date & time: 05/02/24  1327     Patient presents with: Cough   Crystal Wilcox is a 48 y.o. female.  48 year old female presents to ED with complaints of cough since Friday getting worse in severity.  Patient reports she has tried DayQuil and NyQuil at home with minimal relief.  She does have some production with a cough described as a white clear mucus.  Patient denies fever, headache, visual disturbances, chest pain, shortness of breath.  She does report at times she will have some intermittent lightheadedness when walking around that lasts for a few seconds.  Patient denies any lightheadedness or dizziness currently.  Patient has history of hypertension no other notable medical history.    Prior to Admission medications   Medication Sig Start Date End Date Taking? Authorizing Provider  benzonatate  (TESSALON ) 100 MG capsule Take 1 capsule (100 mg total) by mouth every 8 (eight) hours. 05/02/24  Yes Myriam Fonda RAMAN, PA-C  valsartan  (DIOVAN ) 320 MG tablet Take 1 tablet (320 mg total) by mouth daily. 05/02/24  Yes Myriam Fonda RAMAN, PA-C  benzonatate  (TESSALON ) 100 MG capsule Take 1 capsule (100 mg total) by mouth every 8 (eight) hours. 08/21/23   Small, Brooke L, PA  cyclobenzaprine  (FLEXERIL ) 10 MG tablet Take 1 tablet (10 mg total) by mouth 2 (two) times daily as needed for muscle spasms. 05/30/23   Barrett, Jamie N, PA-C  ferrous sulfate 325 (65 FE) MG tablet Take 325 mg by mouth daily with breakfast.    [provider]  ibuprofen  (ADVIL ) 800 MG tablet Take 1 tablet (800 mg total) by mouth 3 (three) times daily. 05/30/23   Barrett, Warren SAILOR, PA-C  potassium chloride  SA (KLOR-CON  M) 20 MEQ tablet Take 1 tablet (20 mEq total) by mouth daily for 3 days. 01/21/23 01/24/23  Joesph Shaver Scales, PA-C  predniSONE  (STERAPRED UNI-PAK 21 TAB) 10 MG (21) TBPK tablet Take by mouth daily. Take 6 tabs  by mouth daily  for 1 days, then 5 tabs for 2 days, then 4 tabs for 2 days, then 3 tabs for 2 days, 2 tabs for 2 days, then 1 tab by mouth daily for 2 days 08/21/23   Small, Brooke L, PA  valsartan  (DIOVAN ) 320 MG tablet Take 320 mg by mouth daily. 12/07/21   [provider]    Allergies: Patient has no known allergies.    Review of Systems  HENT:  Positive for congestion.   Respiratory:  Positive for cough.   All other systems reviewed and are negative.   Updated Vital Signs BP (!) 170/116   Pulse 78   Temp 99.1 F (37.3 C) (Oral)   Resp 17   SpO2 100%   Physical Exam Vitals and nursing note reviewed.  Constitutional:      Appearance: Normal appearance.  HENT:     Head: Normocephalic and atraumatic.     Nose: Congestion present.     Mouth/Throat:     Mouth: Mucous membranes are moist.     Pharynx: Posterior oropharyngeal erythema present. No oropharyngeal exudate.     Comments: Mild erythema and swelling of tonsils.  No lymphadenopathy noted. Eyes:     Extraocular Movements: Extraocular movements intact.     Conjunctiva/sclera: Conjunctivae normal.     Pupils: Pupils are equal, round, and reactive to light.  Cardiovascular:     Rate and Rhythm: Normal rate.  Heart sounds: Normal heart sounds.  Pulmonary:     Effort: Pulmonary effort is normal. No respiratory distress.     Breath sounds: Normal breath sounds.     Comments: Lungs are clear to auscultation in all fields.  Patient has no reported shortness of breath or chest pain. Musculoskeletal:        General: No tenderness. Normal range of motion.     Cervical back: Normal range of motion.     Comments: No lower extremity edema noted.  Skin:    General: Skin is warm.     Capillary Refill: Capillary refill takes less than 2 seconds.  Neurological:     General: No focal deficit present.     Mental Status: She is alert.  Psychiatric:        Mood and Affect: Mood normal.        Behavior: Behavior normal.      (all labs ordered are listed, but only abnormal results are displayed) Labs Reviewed  RESP PANEL BY RT-PCR (RSV, FLU A&B, COVID)  [id number]    EKG: None  Radiology: DG Chest 2 View Result Date: 05/02/2024 EXAM: 2 VIEW(S) XRAY OF THE CHEST 05/02/2024 02:12:48 PM COMPARISON: 08/21/2023 CLINICAL HISTORY: cough FINDINGS: LUNGS AND PLEURA: No focal pulmonary opacity. No pulmonary edema. No pleural effusion. No pneumothorax. HEART AND MEDIASTINUM: No acute abnormality of the cardiac and mediastinal silhouettes. BONES AND SOFT TISSUES: No acute osseous abnormality. IMPRESSION: 1. No acute cardiopulmonary process. Electronically signed by: Lynwood Seip MD 05/02/2024 02:41 PM EST RP Workstation: [id number]    Procedures   Medications Ordered in the ED  benzonatate  (TESSALON ) capsule 100 mg (100 mg Oral Given 05/02/24 1616)    48 y.o. female presents to the ED with complaints of cough. The differential diagnosis includes but is not limited to pneumonia, strep throat, viral illness, mono (Ddx)  On arrival pt is nontoxic, vitals are remarkable for hypertension. Exam significant for cough and clear lungs to auscultation all fields.  I ordered medication Tessalon  for cough  Lab Tests:  Respiratory panel ordered in triage.  Which were all negative  Imaging Studies ordered:  Chest x-ray ordered in triage negative for any acute abnormalities.  ED Course:   Patient sitting comfortably in ED bed in no acute distress nontoxic-appearing.  Patient has a Centor score of 0.  There is no exudates noted in tonsils but there is some mild erythema and swelling.  Patient denies any symptoms of sore throat or difficulty swallowing.  X-ray was negative for any signs of consolidation and respiratory panel was negative.  Patient is afebrile and has no other associated symptoms.  On reassessment patient is sitting comfortably in triage room with no new complaints.  Patient advises she has been prescribed  valsartan  for hypertension and in between PCPs currently.  She advises she has only has 2 pills left and takes it 1 time per day.  She reports taking it right before arrival.  Patient has an establish appointment with her new PCP in January for establishment of care.  Patient was given referral to PCP at [facility] and refill valsartan  for 1 month.  Patient was given Tessalon  for cough and advised to monitor for any worsening symptoms including fever, chest pain, shortness of breath, dizziness.  Patient agreed with treatment plan and was comfortable with discharge at this time.   Portions of this note were generated with Scientist, clinical (histocompatibility and immunogenetics). Dictation errors may occur despite best attempts at proofreading.   Final  diagnoses:  Acute cough    ED Discharge Orders          Ordered    valsartan  (DIOVAN ) 320 MG tablet  Daily        05/02/24 1627    benzonatate  (TESSALON ) 100 MG capsule  Every 8 hours        05/02/24 1627               Myriam Fonda RAMAN, [place] 05/03/24 1519    Mannie Pac T, DO 05/11/24 [phone]

## 2024-05-02 NOTE — Discharge Instructions (Addendum)
 Imaging, exam, and labs are reassuring today.  I have refilled your valsartan and given you benzonatate  for cough.  Please take it at night to help with cough.  I have given you a referral to [facility] please call them to establish an appointment for further management of hypertension and follow-up for cough.  If you experience fever, shortness of breath, chest pain, dizziness or any other concerning symptom please return to ED for further evaluation.

## 2024-05-02 NOTE — ED Triage Notes (Signed)
 Pt reports with cough and pain in chest when coughing since Friday. Pt states that she is spitting out white mucus.

## 2024-05-03 ENCOUNTER — Other Ambulatory Visit: Payer: Self-pay

## 2024-05-03 ENCOUNTER — Ambulatory Visit
Admission: EM | Admit: 2024-05-03 | Discharge: 2024-05-03 | Disposition: A | Discharge status: Home / Self Care | Attending: Family Medicine | Admitting: Family Medicine

## 2024-05-03 DIAGNOSIS — I10 Essential (primary) hypertension: Secondary | ICD-10-CM | POA: Diagnosis not present

## 2024-05-03 HISTORY — DX: Essential (primary) hypertension: I10

## 2024-05-03 MED ORDER — VALSARTAN-HYDROCHLOROTHIAZIDE 320-12.5 MG PO TABS: 1.0000 | ORAL_TABLET | Freq: Every day | ORAL | 0 refills | Status: DC

## 2024-05-03 NOTE — ED Provider Notes (Signed)
 [facility] - URGENT [facility]ARE C[facility]  Note:  This document was prepared using Conservation officer, historic buildings and may include unintentional dictation errors.  MRN: [id number] DOB: 07/11/1975  Subjective:   Crystal Wilcox is a 48 y.o. female presenting for refill of her blood pressure medication valsartan hydrochlorothiazide.  Patient was seen through the emergency room and this medication was filled yesterday but was only for valsartan.  Patient is requesting the combination medication.  She still plans on following up with her PCP.  Currently denies any particular symptoms.  No current facility-administered medications for this encounter.  Current Outpatient Medications:    benzonatate  (TESSALON ) 100 MG capsule, Take 1 capsule (100 mg total) by mouth every 8 (eight) hours., Disp: 21 capsule, Rfl: 0   benzonatate  (TESSALON ) 100 MG capsule, Take 1 capsule (100 mg total) by mouth every 8 (eight) hours., Disp: 21 capsule, Rfl: 0   cyclobenzaprine  (FLEXERIL ) 10 MG tablet, Take 1 tablet (10 mg total) by mouth 2 (two) times daily as needed for muscle spasms., Disp: 20 tablet, Rfl: 0   ferrous sulfate 325 (65 FE) MG tablet, Take 325 mg by mouth daily with breakfast., Disp: , Rfl:    ibuprofen  (ADVIL ) 800 MG tablet, Take 1 tablet (800 mg total) by mouth 3 (three) times daily., Disp: 21 tablet, Rfl: 0   potassium chloride  SA (KLOR-CON  M) 20 MEQ tablet, Take 1 tablet (20 mEq total) by mouth daily for 3 days., Disp: 3 tablet, Rfl: 0   predniSONE  (STERAPRED UNI-PAK 21 TAB) 10 MG (21) TBPK tablet, Take by mouth daily. Take 6 tabs by mouth daily  for 1 days, then 5 tabs for 2 days, then 4 tabs for 2 days, then 3 tabs for 2 days, 2 tabs for 2 days, then 1 tab by mouth daily for 2 days, Disp: 36 tablet, Rfl: 0   valsartan (DIOVAN) 320 MG tablet, Take 320 mg by mouth daily., Disp: , Rfl:    valsartan (DIOVAN) 320 MG tablet, Take 1 tablet (320 mg total) by mouth daily., Disp: 30 tablet, Rfl: 0    valsartan-hydrochlorothiazide (DIOVAN-HCT) 320-12.5 MG tablet, Take 1 tablet by mouth daily., Disp: , Rfl:    No Known Allergies  Past Medical History:  Diagnosis Date   Abortion history    G0P0010   GERD (gastroesophageal reflux disease)    Malaria    Hx of Malaria   Pap smear abnormality of cervix with ASCUS favoring benign    Colposcopy 10/16/09 - Normal     Past Surgical History:  Procedure Laterality Date   CESAREAN SECTION  12/10/2011   Procedure: CESAREAN SECTION;  Surgeon: Rosaline LITTIE Cobble, MD;  Location: [facility] OR[facility];  Service: Gynecology;  Laterality: N/A;   DILATION AND CURETTAGE OF UTERUS      Family History  Problem Relation Age of Onset   Diabetes Father    Anesthesia problems Neg Hx    Other Neg Hx     Social History   Tobacco Use   Smoking status: Never   Smokeless tobacco: Never  Substance Use Topics   Alcohol use: No   Drug use: No    ROS   Objective:   Vitals: BP (!) 145/99 (BP Location: Right Arm)   Pulse 85   Temp 98.5 F (36.9 C) (Oral)   Resp 18   Ht 5' 4 (1.626 m)   Wt 253 lb (114.8 kg)   SpO2 96%   BMI 43.43 kg/m   BP Readings from Last 3  Encounters:  05/03/24 (!) 145/99  05/02/24 (!) 170/116  08/21/23 135/88   Physical Exam Constitutional:      General: She is not in acute distress.    Appearance: Normal appearance. She is well-developed. She is not ill-appearing, toxic-appearing or diaphoretic.  HENT:     Head: Normocephalic and atraumatic.     Nose: Nose normal.     Mouth/Throat:     Mouth: Mucous membranes are moist.  Eyes:     General: No scleral icterus.       Right eye: No discharge.        Left eye: No discharge.     Extraocular Movements: Extraocular movements intact.  Cardiovascular:     Rate and Rhythm: Normal rate.  Pulmonary:     Effort: Pulmonary effort is normal.  Skin:    General: Skin is warm and dry.  Neurological:     General: No focal deficit present.     Mental Status: She is alert and oriented  to person, place, and time.  Psychiatric:        Mood and Affect: Mood normal.        Behavior: Behavior normal.     Assessment and Plan :   PDMP not reviewed this encounter.  1. Essential hypertension    The correct prescription was sent for the patient, specifically valsartan hydrochlorothiazide 320 mg - 12.5 mg.  Follow-up with new PCP.   Christopher Savannah, [place] 05/03/24 8365

## 2024-05-03 NOTE — Discharge Instructions (Addendum)
 I have refilled your valsartan-hydrochlorothiazide medication for 60 days. Keep your follow up appointment with your new PCP.   For diabetes or elevated blood sugar, please make sure you are limiting and avoiding starchy, carbohydrate foods like pasta, breads, sweet breads, pastry, rice, potatoes, desserts. These foods can elevate your blood sugar. Also, limit and avoid drinks that contain a lot of sugar such as sodas, sweet teas, fruit juices.  Drinking plain water will be much more helpful, try 64 ounces of water daily.  It is okay to flavor your water naturally by cutting cucumber, lemon, mint or lime, placing it in a picture with water and drinking it over a period of 24-48 hours as long as it remains refrigerated.  For elevated blood pressure, make sure you are monitoring salt in your diet.  Do not eat restaurant foods and limit processed foods at home. I highly recommend you prepare and cook your own foods at home.  Processed foods include things like frozen meals, pre-seasoned meats and dinners, deli meats, canned foods as these foods contain a high amount of sodium/salt.  Make sure you are paying attention to sodium labels on foods you buy at the grocery store. Buy your spices separately such as garlic powder, onion powder, cumin, cayenne, parsley flakes so that you can avoid seasonings that contain salt. However, salt-free seasonings are available and can be used, an example is Mrs. Dash and includes a lot of different mixtures that do not contain salt.  Lastly, when cooking using oils that are healthier for you is important. This includes olive oil, avocado oil, canola oil. We have discussed a lot of foods to avoid but below is a list of foods that can be very healthy to use in your diet whether it is for diabetes, cholesterol, high blood pressure, or in general healthy eating.  Salads - kale, spinach, cabbage, spring mix, arugula Fruits - avocadoes, berries (blueberries, raspberries,  blackberries), apples, oranges, pomegranate, grapefruit, kiwi Vegetables - asparagus, cauliflower, broccoli, green beans, brussel sprouts, bell peppers, beets; stay away from or limit starchy vegetables like potatoes, carrots, peas Other general foods - kidney beans, egg whites, almonds, walnuts, sunflower seeds, pumpkin seeds, fat free yogurt, almond milk, flax seeds, quinoa, oats  Meat - It is better to eat lean meats and limit your red meat including pork to once a week.  Wild caught fish, chicken breast are good options as they tend to be leaner sources of good protein. Still be mindful of the sodium labels for the meats you buy.  DO NOT EAT ANY FOODS ON THIS LIST THAT YOU ARE ALLERGIC TO. For more specific needs, I highly recommend consulting a dietician or nutritionist but this can definitely be a good starting point.

## 2024-05-03 NOTE — ED Triage Notes (Signed)
 Pt c/o elevated BP. Pt stated she is currently on diovan/hydrochlorothiazide  and due to change of insurance she is in process of getting a  new Primary MD who takes Medicaid.Pt stated that she is on her last HTN tablet . Last dose taken today.

## 2024-07-04 ENCOUNTER — Other Ambulatory Visit: Payer: Self-pay

## 2024-07-04 ENCOUNTER — Encounter: Payer: Self-pay | Admitting: *Deleted

## 2024-07-04 ENCOUNTER — Ambulatory Visit
Admission: EM | Admit: 2024-07-04 | Discharge: 2024-07-04 | Disposition: A | Discharge status: Home / Self Care | Attending: Family Medicine | Admitting: Family Medicine

## 2024-07-04 DIAGNOSIS — Z760 Encounter for issue of repeat prescription: Secondary | ICD-10-CM | POA: Diagnosis not present

## 2024-07-04 DIAGNOSIS — I10 Essential (primary) hypertension: Secondary | ICD-10-CM

## 2024-07-04 MED ORDER — VALSARTAN-HYDROCHLOROTHIAZIDE 320-12.5 MG PO TABS: 1.0000 | ORAL_TABLET | Freq: Every day | ORAL | 0 refills | Status: AC

## 2024-07-04 NOTE — ED Triage Notes (Signed)
 PT needs a refill on BP meds. Pt has a PCP appt at the end of the month. Pt has one remaining pill in bottle.

## 2024-07-04 NOTE — ED Provider Notes (Signed)
 " [facility]    CSN: [id number] Arrival date & time: 07/04/24  1745      History   Chief Complaint Chief Complaint  Patient presents with   Medication Refill    HPI Crystal Wilcox is a 49 y.o. female presents for medication refill.  Patient has a past medical history of hypertension currently on Diovan -HCT.  States this works well for her and her blood pressure is controlled.  She has an appointment with a new PCP at the end of this month does not have enough medication to get her through till then.  Denies any headaches, chest pain or shortness of breath.  No other concerns   Medication Refill   Past Medical History:  Diagnosis Date   Abortion history    [id number]   GERD (gastroesophageal reflux disease)    Hypertension    Malaria    Hx of Malaria   Pap smear abnormality of cervix with ASCUS favoring benign    Colposcopy 10/16/09 - Normal    Patient Active Problem List   Diagnosis Date Noted   Iron  deficiency anemia due to chronic blood loss 01/03/2022   De Quervain's tenosynovitis, bilateral 03/07/2012   Abdominal pain 02/25/2011   Overweight 09/03/2010   Dyspepsia 09/03/2010   GERD 02/18/2010   MALARIA, HX OF 12/29/2006    Past Surgical History:  Procedure Laterality Date   CESAREAN SECTION  12/10/2011   Procedure: CESAREAN SECTION;  Surgeon: Rosaline LITTIE Cobble, MD;  Location: [facility];  Service: Gynecology;  Laterality: N/A;   DILATION AND CURETTAGE OF UTERUS      OB History     Gravida  2   Para  1   Term  1   Preterm  0   AB  1   Living  1      SAB  0   IAB  1   Ectopic      Multiple      Live Births  1            Home Medications    Prior to Admission medications  Medication Sig Start Date End Date Taking? Authorizing Provider  ferrous sulfate 325 (65 FE) MG tablet Take 325 mg by mouth daily with breakfast.   Yes [provider]  valsartan -hydrochlorothiazide  (DIOVAN -HCT) 320-12.5 MG tablet Take 1 tablet  by mouth daily. 07/04/24   Loreda Myla SAUNDERS, NP    Family History Family History  Problem Relation Age of Onset   Diabetes Father    Anesthesia problems Neg Hx    Other Neg Hx     Social History Social History[1]   Allergies   Patient has no known allergies.   Review of Systems Review of Systems  Constitutional:        Encounter for medication refill     Physical Exam Triage Vital Signs ED Triage Vitals  Encounter Vitals Group     BP 07/04/24 1805 128/87     Girls Systolic BP Percentile --      Girls Diastolic BP Percentile --      Boys Systolic BP Percentile --      Boys Diastolic BP Percentile --      Pulse Rate 07/04/24 1805 78     Resp 07/04/24 1805 20     Temp 07/04/24 1805 98.1 F (36.7 C)     Temp src --      SpO2 07/04/24 1805 95 %     Weight --  Height --      Head Circumference --      Peak Flow --      Pain Score 07/04/24 1801 0     Pain Loc --      Pain Education --      Exclude from Growth Chart --    No data found.  Updated Vital Signs BP 128/87   Pulse 78   Temp 98.1 F (36.7 C)   Resp 20   SpO2 95%   Visual Acuity Right Eye Distance:   Left Eye Distance:   Bilateral Distance:    Right Eye Near:   Left Eye Near:    Bilateral Near:     Physical Exam Vitals and nursing note reviewed.  Constitutional:      Appearance: Normal appearance.  HENT:     Head: Normocephalic and atraumatic.  Eyes:     Pupils: Pupils are equal, round, and reactive to light.  Cardiovascular:     Rate and Rhythm: Normal rate.  Pulmonary:     Effort: Pulmonary effort is normal.  Skin:    General: Skin is warm and dry.  Neurological:     General: No focal deficit present.     Mental Status: She is alert and oriented to person, place, and time.  Psychiatric:        Mood and Affect: Mood normal.        Behavior: Behavior normal.      UC Treatments / Results  Labs (all labs ordered are listed, but only abnormal results are displayed) Labs  Reviewed - No data to display  EKG   Radiology No results found.  Procedures Procedures (including critical care time)  Medications Ordered in UC Medications - No data to display  Initial Impression / Assessment and Plan / UC Course  I have reviewed the triage vital signs and the nursing notes.  Pertinent labs & imaging results that were available during my care of the patient were reviewed by me and considered in my medical decision making (see chart for details).     30-day refill provided for patient blood pressure medication.  She will follow-up with her PCP at her scheduled appointment at the end of this month.  I instructed her additional refills need to go through her PCP and she verbalized understanding. Final Clinical Impressions(s) / UC Diagnoses   Final diagnoses:  Encounter for medication refill  Essential hypertension     Discharge Instructions      I have sent a refill for your blood pressure medication.  Follow-up with your PCP at your scheduled appointment at the end of this month for any additional refills.    ED Prescriptions     Medication Sig Dispense Auth. Provider   valsartan -hydrochlorothiazide  (DIOVAN -HCT) 320-12.5 MG tablet Take 1 tablet by mouth daily. 30 tablet Mattalyn Anderegg, Jodi R, NP      PDMP not reviewed this encounter.    [1]  Social History Tobacco Use   Smoking status: Never   Smokeless tobacco: Never  Substance Use Topics   Alcohol use: No   Drug use: No     Loreda Myla SAUNDERS, NP 07/04/24 1821  "

## 2024-07-04 NOTE — Discharge Instructions (Addendum)
 I have sent a refill for your blood pressure medication.  Follow-up with your PCP at your scheduled appointment at the end of this month for any additional refills.
